# Patient Record
Sex: Male | Born: 1940
Health system: Southern US, Community
[De-identification: ages and names within clinical notes are randomized; demographics above are authoritative.]

## PROBLEM LIST (undated history)

## (undated) DIAGNOSIS — Z794 Long term (current) use of insulin: Secondary | ICD-10-CM

## (undated) DIAGNOSIS — N400 Enlarged prostate without lower urinary tract symptoms: Secondary | ICD-10-CM

## (undated) DIAGNOSIS — I6523 Occlusion and stenosis of bilateral carotid arteries: Secondary | ICD-10-CM

## (undated) DIAGNOSIS — I2510 Atherosclerotic heart disease of native coronary artery without angina pectoris: Secondary | ICD-10-CM

## (undated) DIAGNOSIS — C801 Malignant (primary) neoplasm, unspecified: Secondary | ICD-10-CM

## (undated) DIAGNOSIS — R0600 Dyspnea, unspecified: Secondary | ICD-10-CM

## (undated) DIAGNOSIS — Z972 Presence of dental prosthetic device (complete) (partial): Secondary | ICD-10-CM

## (undated) DIAGNOSIS — T753XXA Motion sickness, initial encounter: Secondary | ICD-10-CM

## (undated) DIAGNOSIS — R0609 Other forms of dyspnea: Secondary | ICD-10-CM

## (undated) DIAGNOSIS — M1990 Unspecified osteoarthritis, unspecified site: Secondary | ICD-10-CM

## (undated) DIAGNOSIS — I714 Abdominal aortic aneurysm, without rupture: Secondary | ICD-10-CM

## (undated) DIAGNOSIS — F32A Depression, unspecified: Secondary | ICD-10-CM

## (undated) DIAGNOSIS — N529 Male erectile dysfunction, unspecified: Secondary | ICD-10-CM

## (undated) DIAGNOSIS — E785 Hyperlipidemia, unspecified: Secondary | ICD-10-CM

## (undated) DIAGNOSIS — I749 Embolism and thrombosis of unspecified artery: Secondary | ICD-10-CM

## (undated) DIAGNOSIS — E119 Type 2 diabetes mellitus without complications: Secondary | ICD-10-CM

## (undated) DIAGNOSIS — I10 Essential (primary) hypertension: Secondary | ICD-10-CM

## (undated) DIAGNOSIS — Z8719 Personal history of other diseases of the digestive system: Secondary | ICD-10-CM

## (undated) DIAGNOSIS — R5383 Other fatigue: Secondary | ICD-10-CM

## (undated) DIAGNOSIS — I350 Nonrheumatic aortic (valve) stenosis: Secondary | ICD-10-CM

## (undated) DIAGNOSIS — M5134 Other intervertebral disc degeneration, thoracic region: Secondary | ICD-10-CM

## (undated) HISTORY — DX: Unspecified osteoarthritis, unspecified site: M19.90

## (undated) HISTORY — DX: Type 2 diabetes mellitus without complications: E11.9

## (undated) HISTORY — PX: HIP SURGERY: SHX245

## (undated) HISTORY — DX: Benign prostatic hyperplasia without lower urinary tract symptoms: N40.0

## (undated) HISTORY — PX: TONSILLECTOMY: SUR1361

## (undated) HISTORY — DX: Dyspnea, unspecified: R06.00

## (undated) HISTORY — PX: HERNIA REPAIR: SHX51

## (undated) HISTORY — PX: ANKLE SURGERY: SHX546

## (undated) HISTORY — PX: INGUINAL HERNIA REPAIR: SUR1180

## (undated) HISTORY — DX: Other fatigue: R53.83

## (undated) HISTORY — DX: Male erectile dysfunction, unspecified: N52.9

## (undated) HISTORY — DX: Essential (primary) hypertension: I10

## (undated) HISTORY — DX: Hyperlipidemia, unspecified: E78.5

## (undated) HISTORY — DX: Other forms of dyspnea: R06.09

## (undated) HISTORY — PX: CARDIAC CATHETERIZATION: SHX172

---
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
## 1983-11-23 DIAGNOSIS — C4402 Squamous cell carcinoma of skin of lip: Secondary | ICD-10-CM

## 1983-11-23 HISTORY — DX: Squamous cell carcinoma of skin of lip: C44.02

## 1987-11-23 DIAGNOSIS — I749 Embolism and thrombosis of unspecified artery: Secondary | ICD-10-CM

## 1987-11-23 HISTORY — DX: Embolism and thrombosis of unspecified artery: I74.9

## 2007-10-12 ENCOUNTER — Ambulatory Visit: Payer: Self-pay | Admitting: Gastroenterology

## 2009-04-12 ENCOUNTER — Emergency Department: Payer: Self-pay | Admitting: Emergency Medicine

## 2012-01-05 ENCOUNTER — Ambulatory Visit: Payer: Self-pay | Admitting: Family Medicine

## 2012-07-23 DIAGNOSIS — I2510 Atherosclerotic heart disease of native coronary artery without angina pectoris: Secondary | ICD-10-CM | POA: Insufficient documentation

## 2012-08-16 DIAGNOSIS — I350 Nonrheumatic aortic (valve) stenosis: Secondary | ICD-10-CM

## 2012-08-16 HISTORY — DX: Nonrheumatic aortic (valve) stenosis: I35.0

## 2012-08-30 ENCOUNTER — Ambulatory Visit: Payer: Self-pay | Admitting: Internal Medicine

## 2012-08-30 DIAGNOSIS — I2510 Atherosclerotic heart disease of native coronary artery without angina pectoris: Secondary | ICD-10-CM

## 2012-08-30 HISTORY — DX: Atherosclerotic heart disease of native coronary artery without angina pectoris: I25.10

## 2012-09-01 ENCOUNTER — Other Ambulatory Visit: Payer: Self-pay

## 2012-09-01 ENCOUNTER — Encounter (HOSPITAL_COMMUNITY): Payer: Self-pay | Admitting: Pharmacy Technician

## 2012-09-01 ENCOUNTER — Institutional Professional Consult (permissible substitution) (INDEPENDENT_AMBULATORY_CARE_PROVIDER_SITE_OTHER): Payer: Medicare Other | Admitting: Cardiothoracic Surgery

## 2012-09-01 VITALS — BP 112/66 | HR 86 | Resp 20 | Ht 70.9 in | Wt 194.0 lb

## 2012-09-01 DIAGNOSIS — E119 Type 2 diabetes mellitus without complications: Secondary | ICD-10-CM | POA: Insufficient documentation

## 2012-09-01 DIAGNOSIS — I10 Essential (primary) hypertension: Secondary | ICD-10-CM | POA: Insufficient documentation

## 2012-09-01 DIAGNOSIS — N400 Enlarged prostate without lower urinary tract symptoms: Secondary | ICD-10-CM | POA: Insufficient documentation

## 2012-09-01 DIAGNOSIS — R5383 Other fatigue: Secondary | ICD-10-CM | POA: Insufficient documentation

## 2012-09-01 DIAGNOSIS — I200 Unstable angina: Secondary | ICD-10-CM

## 2012-09-01 DIAGNOSIS — R0600 Dyspnea, unspecified: Secondary | ICD-10-CM | POA: Insufficient documentation

## 2012-09-01 DIAGNOSIS — I2510 Atherosclerotic heart disease of native coronary artery without angina pectoris: Secondary | ICD-10-CM

## 2012-09-01 DIAGNOSIS — R0609 Other forms of dyspnea: Secondary | ICD-10-CM

## 2012-09-01 DIAGNOSIS — E785 Hyperlipidemia, unspecified: Secondary | ICD-10-CM | POA: Insufficient documentation

## 2012-09-01 NOTE — Patient Instructions (Signed)
Begin your prescription of metoprolol succinate 25 mg daily Your surgery will be scheduled for October 17 at [facility] He should stop your metformin prior to surgery last dose the morning of October 15
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
## 2012-09-05 ENCOUNTER — Ambulatory Visit (HOSPITAL_COMMUNITY)
Admission: RE | Admit: 2012-09-05 | Discharge: 2012-09-05 | Disposition: A | Discharge status: Home / Self Care | Payer: Medicare Other | Source: Ambulatory Visit | Attending: Cardiothoracic Surgery | Admitting: Cardiothoracic Surgery

## 2012-09-05 ENCOUNTER — Encounter (HOSPITAL_COMMUNITY)
Admission: RE | Admit: 2012-09-05 | Discharge: 2012-09-05 | Disposition: A | Discharge status: Home / Self Care | Payer: Medicare Other | Source: Ambulatory Visit | Attending: Cardiothoracic Surgery | Admitting: Cardiothoracic Surgery

## 2012-09-05 ENCOUNTER — Encounter (HOSPITAL_COMMUNITY): Payer: Self-pay

## 2012-09-05 VITALS — BP 93/60 | HR 71 | Temp 98.0°F | Resp 20 | Ht 71.0 in | Wt 194.8 lb

## 2012-09-05 DIAGNOSIS — I2510 Atherosclerotic heart disease of native coronary artery without angina pectoris: Secondary | ICD-10-CM

## 2012-09-05 DIAGNOSIS — E119 Type 2 diabetes mellitus without complications: Secondary | ICD-10-CM | POA: Insufficient documentation

## 2012-09-05 DIAGNOSIS — E785 Hyperlipidemia, unspecified: Secondary | ICD-10-CM | POA: Insufficient documentation

## 2012-09-05 DIAGNOSIS — Z01810 Encounter for preprocedural cardiovascular examination: Secondary | ICD-10-CM

## 2012-09-05 DIAGNOSIS — Z01818 Encounter for other preprocedural examination: Secondary | ICD-10-CM | POA: Insufficient documentation

## 2012-09-05 DIAGNOSIS — I10 Essential (primary) hypertension: Secondary | ICD-10-CM

## 2012-09-05 DIAGNOSIS — Z01812 Encounter for preprocedural laboratory examination: Secondary | ICD-10-CM | POA: Insufficient documentation

## 2012-09-05 HISTORY — DX: Atherosclerotic heart disease of native coronary artery without angina pectoris: I25.10

## 2012-09-05 LAB — BLOOD GAS, ARTERIAL
Acid-Base Excess: 0.3 mmol/L (ref 0.0–2.0)
Bicarbonate: 24.2 mEq/L — ABNORMAL HIGH (ref 20.0–24.0)
Drawn by: 344381
FIO2: 0.21 %
O2 Saturation: 96.5 %
Patient temperature: 98.6
TCO2: 25.3 mmol/L (ref 0–100)
pCO2 arterial: 37.4 mmHg (ref 35.0–45.0)
pH, Arterial: 7.427 (ref 7.350–7.450)
pO2, Arterial: 83.3 mmHg (ref 80.0–100.0)

## 2012-09-05 LAB — CBC
HCT: 38.1 % — ABNORMAL LOW (ref 39.0–52.0)
Hemoglobin: 12.9 g/dL — ABNORMAL LOW (ref 13.0–17.0)
MCH: 29.4 pg (ref 26.0–34.0)
MCHC: 33.9 g/dL (ref 30.0–36.0)
MCV: 86.8 fL (ref 78.0–100.0)
Platelets: 280 10*3/uL (ref 150–400)
RBC: 4.39 MIL/uL (ref 4.22–5.81)
RDW: 12.2 % (ref 11.5–15.5)
WBC: 7.9 10*3/uL (ref 4.0–10.5)

## 2012-09-05 LAB — URINALYSIS, ROUTINE W REFLEX MICROSCOPIC
Bilirubin Urine: NEGATIVE
Glucose, UA: >1000 mg/dL — AB
Hgb urine dipstick: NEGATIVE
Ketones, ur: NEGATIVE mg/dL
Leukocytes, UA: NEGATIVE
Nitrite: NEGATIVE
Protein, ur: NEGATIVE mg/dL
Specific Gravity, Urine: 1.024 (ref 1.005–1.030)
Urobilinogen, UA: 1 mg/dL (ref 0.0–1.0)
pH: 6 (ref 5.0–8.0)

## 2012-09-05 LAB — COMPREHENSIVE METABOLIC PANEL
ALT: 23 U/L (ref 0–53)
AST: 19 U/L (ref 0–37)
Albumin: 3.9 g/dL (ref 3.5–5.2)
Alkaline Phosphatase: 102 U/L (ref 39–117)
BUN: 26 mg/dL — ABNORMAL HIGH (ref 6–23)
CO2: 26 mEq/L (ref 19–32)
Calcium: 10.1 mg/dL (ref 8.4–10.5)
Chloride: 98 mEq/L (ref 96–112)
Creatinine, Ser: 1.02 mg/dL (ref 0.50–1.35)
GFR calc Af Amer: 83 mL/min — ABNORMAL LOW (ref >90)
GFR calc non Af Amer: 71 mL/min — ABNORMAL LOW (ref >90)
Glucose, Bld: 373 mg/dL — ABNORMAL HIGH (ref 70–99)
Potassium: 4.4 mEq/L (ref 3.5–5.1)
Sodium: 133 mEq/L — ABNORMAL LOW (ref 135–145)
Total Bilirubin: 0.7 mg/dL (ref 0.3–1.2)
Total Protein: 7.6 g/dL (ref 6.0–8.3)

## 2012-09-05 LAB — PULMONARY FUNCTION TEST

## 2012-09-05 LAB — PROTIME-INR
INR: 0.99 (ref 0.00–1.49)
Prothrombin Time: 13 seconds (ref 11.6–15.2)

## 2012-09-05 LAB — SURGICAL PCR SCREEN
MRSA, PCR: NEGATIVE
Staphylococcus aureus: POSITIVE — AB

## 2012-09-05 LAB — APTT: aPTT: 27 seconds (ref 24–37)

## 2012-09-05 LAB — HEMOGLOBIN A1C
Hgb A1c MFr Bld: 10.3 % — ABNORMAL HIGH (ref <5.7)
Mean Plasma Glucose: 249 mg/dL — ABNORMAL HIGH (ref <117)

## 2012-09-05 LAB — URINE MICROSCOPIC-ADD ON

## 2012-09-05 LAB — ABO/RH: ABO/RH(D): O POS

## 2012-09-05 NOTE — Progress Notes (Signed)
Requested OV,Stress test,ECHO,cath report from Dr. Philemon Kingdom office.

## 2012-09-05 NOTE — Progress Notes (Signed)
VASCULAR LAB PRELIMINARY  PRELIMINARY  PRELIMINARY  PRELIMINARY  Pre-op Cardiac Surgery  Carotid Findings:  No evidence of significant ICA stenosis and vertebral artery flow is antegrade bilaterally.  Upper Extremity Right Left  Brachial Pressures 99 T 109 T  Radial Waveforms T T  Ulnar Waveforms T T  Palmar Arch (Allen's Test) * **   Findings:  *Right:  Doppler waveforms remain normal with ulnar and radial compressions.                     **Left:  Doppler waveforms decrease >50% with radial and remain normal with ulnar compressions.    Lower  Extremity Right Left  Dorsalis Pedis    Anterior Tibial 92 Severe DM 291 M  Posterior Tibial 190 Severe DM 47 Severe DM  Ankle/Brachial Indices >1.0 >1.0    Findings:  ABI is within normal limits with abnormal Doppler waveforms bilaterally.   Antonio Villarreal, 09/05/2012, 12:57 PM

## 2012-09-05 NOTE — Pre-Procedure Instructions (Signed)
20 TIBURCIO LINDER  09/05/2012   Your procedure is scheduled on:  09-07-2012  Report to [facility] at 5:30 AM.  Call this number if you have problems the morning of surgery: [phone]   Remember:   Do not eat food or drink:After Midnight.      Take these medicines the morning of surgery with A SIP OF WATER: imdur   Do not wear jewelry,  Do not wear lotions, powders, or perfumes. You may wear deodorant.  Do not shave 48 hours prior to surgery. Men may shave face and neck.  Do not bring valuables to the hospital.  Contacts, dentures or bridgework may not be worn into surgery.  Leave suitcase in the car. After surgery it may be brought to your room.    For patients admitted to the hospital, checkout time is 11:00 AM the day of discharge.    *  Special Instructions: Incentive Spirometry - Practice and bring it with you on the day of surgery. Shower using CHG 2 nights before surgery and the night before surgery.  If you shower the day of surgery use CHG.  Use special wash - you have one bottle of CHG for all showers.  You should use approximately 1/3 of the bottle for each shower.      Please read over the following fact sheets that you were given: Pain Booklet, Coughing and Deep Breathing, Blood Transfusion Information, Open Heart Packet, MRSA Information and Surgical Site Infection Prevention

## 2012-09-06 ENCOUNTER — Encounter (HOSPITAL_COMMUNITY): Payer: Self-pay | Admitting: Vascular Surgery

## 2012-09-06 ENCOUNTER — Telehealth: Payer: Self-pay | Admitting: *Deleted

## 2012-09-06 NOTE — Progress Notes (Signed)
2nd request made to Dr. Philemon Kingdom office for office note, cath report,ECHO and stress test.

## 2012-09-06 NOTE — Telephone Encounter (Signed)
Spoke with patient and his wife about having to cancel tomorrow's surgery due to uncontrolled diabetes.  Patient understands and will f/u with PCP for diabetes management and will f/u with Dr. Donata Clay in office on 09/13/12.  All questions answered.  Will continue to follow up as needed.
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
## 2012-09-07 ENCOUNTER — Inpatient Hospital Stay (HOSPITAL_COMMUNITY): Admission: RE | Admit: 2012-09-07 | Payer: Medicare Other | Source: Ambulatory Visit | Admitting: Cardiothoracic Surgery

## 2012-09-07 SURGERY — CORONARY ARTERY BYPASS GRAFTING (CABG)
Anesthesia: General | Site: Chest

## 2012-09-13 ENCOUNTER — Ambulatory Visit (INDEPENDENT_AMBULATORY_CARE_PROVIDER_SITE_OTHER): Payer: Medicare Other | Admitting: Cardiothoracic Surgery

## 2012-09-13 ENCOUNTER — Encounter (HOSPITAL_COMMUNITY): Payer: Self-pay | Admitting: Pharmacy Technician

## 2012-09-13 ENCOUNTER — Other Ambulatory Visit: Payer: Self-pay

## 2012-09-13 ENCOUNTER — Encounter: Payer: Self-pay | Admitting: Cardiothoracic Surgery

## 2012-09-13 VITALS — BP 122/71 | HR 66 | Resp 18 | Ht 70.5 in | Wt 196.0 lb

## 2012-09-13 DIAGNOSIS — IMO0001 Reserved for inherently not codable concepts without codable children: Secondary | ICD-10-CM

## 2012-09-13 DIAGNOSIS — E1165 Type 2 diabetes mellitus with hyperglycemia: Secondary | ICD-10-CM

## 2012-09-13 DIAGNOSIS — I2510 Atherosclerotic heart disease of native coronary artery without angina pectoris: Secondary | ICD-10-CM

## 2012-09-13 DIAGNOSIS — I200 Unstable angina: Secondary | ICD-10-CM

## 2012-09-13 NOTE — Progress Notes (Signed)
PCP is Lamar Blinks, MD Referring Provider is Lamar Blinks, MD  Chief Complaint  Patient presents with  . Coronary Artery Disease    Further discuss surgery    HPI: 71 year old Caucasian male diabetic with multivessel disease scheduled for heart bypass surgery last week. When he reported for preassessment his blood sugar was 390 and surgery was canceled. [facility] and has been placed on Lantus insulin 20 units daily and Humalog 8 units 3 times a day with meals. Blood sugars are running between 120 and 170. He feels better.   Past Medical History  Diagnosis Date  . Hypertension   . Hyperlipidemia   . Diabetes mellitus   . Dyspnea on exertion   . Fatigue   . BPH (benign prostatic hyperplasia)   . Erectile dysfunction   . Arthritis   . Coronary artery disease     Past Surgical History  Procedure Date  . Hernia repair   . Hip surgery   . Ankle surgery   . Tonsillectomy     Family History  Problem Relation Age of Onset  . Heart attack Father     age 68 and 46  . Heart disease Father   . Heart disease Mother   . Coronary artery disease Mother     CABG age 51  . Lung cancer Mother     Social History History  Substance Use Topics  . Smoking status: Former Smoker -- 1.5 packs/day for 35 years    Types: Cigarettes    Quit date: 11/23/1987  . Smokeless tobacco: Never Used  . Alcohol Use: No    Current Outpatient Prescriptions  Medication Sig Dispense Refill  . co-enzyme Q-10 30 MG capsule Take 30 mg by mouth daily.      Marland Kitchen HUMALOG KWIKPEN 100 UNIT/ML injection 8 Units 3 (three) times daily before meals.       . isosorbide mononitrate (IMDUR) 30 MG 24 hr tablet Take 30 mg by mouth daily.       Marland Kitchen LANTUS SOLOSTAR 100 UNIT/ML injection 20 Units every morning.       Marland Kitchen lisinopril-hydrochlorothiazide (PRINZIDE,ZESTORETIC) 10-12.5 MG per tablet Take 1 tablet by mouth daily.      Marland Kitchen lovastatin (MEVACOR) 40 MG  tablet Take 40 mg by mouth at bedtime.      . metoprolol succinate (TOPROL-XL) 25 MG 24 hr tablet Take 25 mg by mouth daily.       . naproxen (NAPROSYN) 500 MG tablet Take 500 mg by mouth 2 (two) times daily between meals as needed. For pain        Allergies  Allergen Reactions  . Januvia (Sitagliptin) Other (See Comments)    GI Upset  . Morphine And Related Nausea And Vomiting    Review of Systems no fever no angina no shortness of breath no rash no edema  BP 122/71  Pulse 66  Resp 18  Ht 5' 10.5" (1.791 m)  Wt 196 lb (88.905 kg)  BMI 27.73 kg/m2  SpO2 95% Physical Exam Alert and comfortable Heart rate regular no murmur Lungs clear No pedal edema Neuro intact  Diagnostic Tests:  CBG's-blood sugar record reviewed showing adequate diabetic control to schedule elective CABG scheduled October 28 at [place] Impression: Three-vessel disease Diabetic control improved  Plan: CABG at [facility] hospital October 28. Procedure and risks reviewed with patient and family. They understand the risk of wound infection is greater when blood  sugars are high and poorly controlled.

## 2012-09-15 ENCOUNTER — Encounter (HOSPITAL_COMMUNITY): Payer: Self-pay

## 2012-09-15 ENCOUNTER — Encounter (HOSPITAL_COMMUNITY)
Admission: RE | Admit: 2012-09-15 | Discharge: 2012-09-15 | Disposition: A | Discharge status: Home / Self Care | Payer: Medicare Other | Source: Ambulatory Visit | Attending: Cardiothoracic Surgery | Admitting: Cardiothoracic Surgery

## 2012-09-15 VITALS — BP 87/56 | HR 65 | Temp 97.4°F | Resp 18

## 2012-09-15 DIAGNOSIS — I2510 Atherosclerotic heart disease of native coronary artery without angina pectoris: Secondary | ICD-10-CM

## 2012-09-15 HISTORY — DX: Embolism and thrombosis of unspecified artery: I74.9

## 2012-09-15 HISTORY — DX: Malignant (primary) neoplasm, unspecified: C80.1

## 2012-09-15 LAB — COMPREHENSIVE METABOLIC PANEL
ALT: 29 U/L (ref 0–53)
AST: 25 U/L (ref 0–37)
Albumin: 3.6 g/dL (ref 3.5–5.2)
Alkaline Phosphatase: 81 U/L (ref 39–117)
BUN: 29 mg/dL — ABNORMAL HIGH (ref 6–23)
CO2: 21 mEq/L (ref 19–32)
Calcium: 9.3 mg/dL (ref 8.4–10.5)
Chloride: 100 mEq/L (ref 96–112)
Creatinine, Ser: 0.89 mg/dL (ref 0.50–1.35)
GFR calc Af Amer: >90 mL/min (ref >90)
GFR calc non Af Amer: 83 mL/min — ABNORMAL LOW (ref >90)
Glucose, Bld: 146 mg/dL — ABNORMAL HIGH (ref 70–99)
Potassium: 4.5 mEq/L (ref 3.5–5.1)
Sodium: 132 mEq/L — ABNORMAL LOW (ref 135–145)
Total Bilirubin: 0.6 mg/dL (ref 0.3–1.2)
Total Protein: 7.1 g/dL (ref 6.0–8.3)

## 2012-09-15 LAB — TYPE AND SCREEN
ABO/RH(D): O POS
ABO/RH(D): O POS
Antibody Screen: NEGATIVE
Antibody Screen: NEGATIVE

## 2012-09-15 LAB — URINALYSIS, ROUTINE W REFLEX MICROSCOPIC
Bilirubin Urine: NEGATIVE
Glucose, UA: NEGATIVE mg/dL
Hgb urine dipstick: NEGATIVE
Ketones, ur: 15 mg/dL — AB
Leukocytes, UA: NEGATIVE
Nitrite: NEGATIVE
Protein, ur: NEGATIVE mg/dL
Specific Gravity, Urine: 1.027 (ref 1.005–1.030)
Urobilinogen, UA: 0.2 mg/dL (ref 0.0–1.0)
pH: 5.5 (ref 5.0–8.0)

## 2012-09-15 LAB — BLOOD GAS, ARTERIAL
Acid-base deficit: 0.9 mmol/L (ref 0.0–2.0)
Bicarbonate: 23.1 mEq/L (ref 20.0–24.0)
Drawn by: 20636
O2 Saturation: 94.9 %
Patient temperature: 98.6
TCO2: 24.2 mmol/L (ref 0–100)
pCO2 arterial: 36.9 mmHg (ref 35.0–45.0)
pH, Arterial: 7.413 (ref 7.350–7.450)
pO2, Arterial: 74.3 mmHg — ABNORMAL LOW (ref 80.0–100.0)

## 2012-09-15 LAB — CBC
HCT: 36.6 % — ABNORMAL LOW (ref 39.0–52.0)
Hemoglobin: 12.5 g/dL — ABNORMAL LOW (ref 13.0–17.0)
MCH: 30 pg (ref 26.0–34.0)
MCHC: 34.2 g/dL (ref 30.0–36.0)
MCV: 88 fL (ref 78.0–100.0)
Platelets: 250 10*3/uL (ref 150–400)
RBC: 4.16 MIL/uL — ABNORMAL LOW (ref 4.22–5.81)
RDW: 12.6 % (ref 11.5–15.5)
WBC: 8.2 10*3/uL (ref 4.0–10.5)

## 2012-09-15 LAB — HEMOGLOBIN A1C
Hgb A1c MFr Bld: 9.5 % — ABNORMAL HIGH (ref <5.7)
Mean Plasma Glucose: 226 mg/dL — ABNORMAL HIGH (ref <117)

## 2012-09-15 LAB — APTT: aPTT: 31 seconds (ref 24–37)

## 2012-09-15 LAB — PROTIME-INR
INR: 1.02 (ref 0.00–1.49)
Prothrombin Time: 13.3 seconds (ref 11.6–15.2)

## 2012-09-15 NOTE — Pre-Procedure Instructions (Signed)
20 Antonio Villarreal  09/15/2012   Your procedure is scheduled on:  Monday September 18, 2012  Report to [facility] at 5:30 AM.  Call this number if you have problems the morning of surgery: [phone]   Remember:   Do not eat food or drink :After Midnight.      Take these medicines the morning of surgery with A SIP OF WATER: isosorbide, metoprolol,    Do not wear jewelry, make-up or nail polish.  Do not wear lotions, powders, or perfumes.   Do not shave 48 hours prior to surgery. Men may shave face and neck.  Do not bring valuables to the hospital.  Contacts, dentures or bridgework may not be worn into surgery.  Leave suitcase in the car. After surgery it may be brought to your room.  For patients admitted to the hospital, checkout time is 11:00 AM the day of discharge.   Patients discharged the day of surgery will not be allowed to drive home.  Name and phone number of your driver: family / friend  Special Instructions: Incentive Spirometry - Practice and bring it with you on the day of surgery. Shower using CHG 2 nights before surgery and the night before surgery.  If you shower the day of surgery use CHG.  Use special wash - you have one bottle of CHG for all showers.  You should use approximately 1/3 of the bottle for each shower.   Please read over the following fact sheets that you were given: Pain Booklet, Coughing and Deep Breathing, Blood Transfusion Information, Open Heart Packet, Total Joint Packet and MRSA Information

## 2012-09-15 NOTE — Progress Notes (Signed)
Pt. Has been seen by Practitioner for diabetes control. Has started insulin & has now returned for PAT appt. For CABG, rescheduled for 09/18/2012.

## 2012-09-17 MED ORDER — SODIUM CHLORIDE 0.9 % IV SOLN
INTRAVENOUS | Status: AC
  Administered 2012-09-18: 70 mL/h via INTRAVENOUS
  Filled 2012-09-17: qty 40

## 2012-09-17 MED ORDER — DEXTROSE 5 % IV SOLN
750.0000 mg | INTRAVENOUS | Status: DC
  Filled 2012-09-17: qty 750

## 2012-09-17 MED ORDER — DEXMEDETOMIDINE HCL IN NACL 400 MCG/100ML IV SOLN
0.1000 ug/kg/h | INTRAVENOUS | Status: AC
  Administered 2012-09-18: 0.2 ug/kg/h via INTRAVENOUS
  Filled 2012-09-17: qty 100

## 2012-09-17 MED ORDER — NITROGLYCERIN IN D5W 200-5 MCG/ML-% IV SOLN
2.0000 ug/min | INTRAVENOUS | Status: AC
  Administered 2012-09-18: 5 ug/min via INTRAVENOUS
  Filled 2012-09-17: qty 250

## 2012-09-17 MED ORDER — EPINEPHRINE HCL 1 MG/ML IJ SOLN
0.5000 ug/min | INTRAVENOUS | Status: DC
  Filled 2012-09-17: qty 4

## 2012-09-17 MED ORDER — PHENYLEPHRINE HCL 10 MG/ML IJ SOLN
30.0000 ug/min | INTRAVENOUS | Status: AC
  Administered 2012-09-18: 20 ug/min via INTRAVENOUS
  Filled 2012-09-17: qty 2

## 2012-09-17 MED ORDER — DOPAMINE-DEXTROSE 3.2-5 MG/ML-% IV SOLN
2.0000 ug/kg/min | INTRAVENOUS | Status: AC
  Administered 2012-09-18: 3 ug/kg/min via INTRAVENOUS
  Filled 2012-09-17: qty 250

## 2012-09-17 MED ORDER — MAGNESIUM SULFATE 50 % IJ SOLN
40.0000 meq | INTRAMUSCULAR | Status: DC
  Filled 2012-09-17: qty 10

## 2012-09-17 MED ORDER — SODIUM CHLORIDE 0.9 % IV SOLN
INTRAVENOUS | Status: AC
  Administered 2012-09-18: 1 [IU]/h via INTRAVENOUS
  Filled 2012-09-17: qty 1

## 2012-09-17 MED ORDER — DEXTROSE 5 % IV SOLN
1.5000 g | INTRAVENOUS | Status: AC
  Administered 2012-09-18: .75 g via INTRAVENOUS
  Administered 2012-09-18: 1.5 g via INTRAVENOUS
  Filled 2012-09-17: qty 1.5

## 2012-09-17 MED ORDER — METOPROLOL TARTRATE 12.5 MG HALF TABLET: 12.5000 mg | ORAL_TABLET | Freq: Once | ORAL | Status: DC

## 2012-09-17 MED ORDER — PAPAVERINE HCL 30 MG/ML IJ SOLN
INTRAMUSCULAR | Status: AC
  Administered 2012-09-18: 09:00:00
  Filled 2012-09-17: qty 2.5

## 2012-09-17 MED ORDER — SODIUM CHLORIDE 0.9 % IV SOLN
1500.0000 mg | INTRAVENOUS | Status: AC
  Administered 2012-09-18: 1500 mg via INTRAVENOUS
  Filled 2012-09-17: qty 1500

## 2012-09-17 MED ORDER — POTASSIUM CHLORIDE 2 MEQ/ML IV SOLN
80.0000 meq | INTRAVENOUS | Status: DC
  Filled 2012-09-17: qty 40

## 2012-09-18 ENCOUNTER — Encounter (HOSPITAL_COMMUNITY)
Admission: RE | Disposition: A | Discharge status: Home / Self Care | Payer: Self-pay | Source: Ambulatory Visit | Attending: Cardiothoracic Surgery

## 2012-09-18 ENCOUNTER — Inpatient Hospital Stay (HOSPITAL_COMMUNITY)
Admission: RE | Admit: 2012-09-18 | Discharge: 2012-09-23 | DRG: 236 | Disposition: A | Discharge status: Home / Self Care | Payer: Medicare Other | Source: Ambulatory Visit | Attending: Cardiothoracic Surgery | Admitting: Cardiothoracic Surgery

## 2012-09-18 ENCOUNTER — Inpatient Hospital Stay (HOSPITAL_COMMUNITY): Payer: Medicare Other

## 2012-09-18 ENCOUNTER — Encounter (HOSPITAL_COMMUNITY): Payer: Self-pay | Admitting: *Deleted

## 2012-09-18 ENCOUNTER — Encounter (HOSPITAL_COMMUNITY): Payer: Self-pay | Admitting: Certified Registered"

## 2012-09-18 ENCOUNTER — Inpatient Hospital Stay (HOSPITAL_COMMUNITY): Payer: Medicare Other | Admitting: Certified Registered"

## 2012-09-18 DIAGNOSIS — E785 Hyperlipidemia, unspecified: Secondary | ICD-10-CM | POA: Diagnosis present

## 2012-09-18 DIAGNOSIS — Z951 Presence of aortocoronary bypass graft: Secondary | ICD-10-CM

## 2012-09-18 DIAGNOSIS — Z23 Encounter for immunization: Secondary | ICD-10-CM

## 2012-09-18 DIAGNOSIS — I209 Angina pectoris, unspecified: Secondary | ICD-10-CM | POA: Diagnosis present

## 2012-09-18 DIAGNOSIS — I2510 Atherosclerotic heart disease of native coronary artery without angina pectoris: Principal | ICD-10-CM | POA: Diagnosis present

## 2012-09-18 DIAGNOSIS — I359 Nonrheumatic aortic valve disorder, unspecified: Secondary | ICD-10-CM | POA: Diagnosis present

## 2012-09-18 DIAGNOSIS — D62 Acute posthemorrhagic anemia: Secondary | ICD-10-CM | POA: Diagnosis not present

## 2012-09-18 DIAGNOSIS — R000 Tachycardia, unspecified: Secondary | ICD-10-CM | POA: Diagnosis present

## 2012-09-18 DIAGNOSIS — N400 Enlarged prostate without lower urinary tract symptoms: Secondary | ICD-10-CM | POA: Diagnosis present

## 2012-09-18 DIAGNOSIS — E119 Type 2 diabetes mellitus without complications: Secondary | ICD-10-CM | POA: Diagnosis present

## 2012-09-18 HISTORY — PX: TEE WITHOUT CARDIOVERSION: SHX5443

## 2012-09-18 HISTORY — PX: CORONARY ARTERY BYPASS GRAFT: SHX141

## 2012-09-18 HISTORY — DX: Presence of aortocoronary bypass graft: Z95.1

## 2012-09-18 LAB — CBC
HCT: 30.7 % — ABNORMAL LOW (ref 39.0–52.0)
Hemoglobin: 10.3 g/dL — ABNORMAL LOW (ref 13.0–17.0)
Hemoglobin: 10.2 g/dL — ABNORMAL LOW (ref 13.0–17.0)
MCH: 29.2 pg (ref 26.0–34.0)
MCH: 28.9 pg (ref 26.0–34.0)
MCHC: 33.8 g/dL (ref 30.0–36.0)
MCHC: 33.2 g/dL (ref 30.0–36.0)
MCV: 86.4 fL (ref 78.0–100.0)
MCV: 87 fL (ref 78.0–100.0)
Platelets: 149 10*3/uL — ABNORMAL LOW (ref 150–400)
Platelets: 152 10*3/uL (ref 150–400)
RBC: 3.53 MIL/uL — ABNORMAL LOW (ref 4.22–5.81)
RBC: 3.53 MIL/uL — ABNORMAL LOW (ref 4.22–5.81)
RDW: 12.7 % (ref 11.5–15.5)
WBC: 11.7 10*3/uL — ABNORMAL HIGH (ref 4.0–10.5)

## 2012-09-18 LAB — POCT I-STAT 4, (NA,K, GLUC, HGB,HCT)
Glucose, Bld: 155 mg/dL — ABNORMAL HIGH (ref 70–99)
Glucose, Bld: 121 mg/dL — ABNORMAL HIGH (ref 70–99)
Glucose, Bld: 105 mg/dL — ABNORMAL HIGH (ref 70–99)
Glucose, Bld: 92 mg/dL (ref 70–99)
Glucose, Bld: 155 mg/dL — ABNORMAL HIGH (ref 70–99)
Glucose, Bld: 118 mg/dL — ABNORMAL HIGH (ref 70–99)
HCT: 34 % — ABNORMAL LOW (ref 39.0–52.0)
HCT: 34 % — ABNORMAL LOW (ref 39.0–52.0)
HCT: 26 % — ABNORMAL LOW (ref 39.0–52.0)
HCT: 25 % — ABNORMAL LOW (ref 39.0–52.0)
HCT: 26 % — ABNORMAL LOW (ref 39.0–52.0)
HCT: 32 % — ABNORMAL LOW (ref 39.0–52.0)
Hemoglobin: 11.6 g/dL — ABNORMAL LOW (ref 13.0–17.0)
Hemoglobin: 11.6 g/dL — ABNORMAL LOW (ref 13.0–17.0)
Hemoglobin: 8.8 g/dL — ABNORMAL LOW (ref 13.0–17.0)
Hemoglobin: 8.5 g/dL — ABNORMAL LOW (ref 13.0–17.0)
Hemoglobin: 8.8 g/dL — ABNORMAL LOW (ref 13.0–17.0)
Hemoglobin: 10.9 g/dL — ABNORMAL LOW (ref 13.0–17.0)
Potassium: 3.9 mEq/L (ref 3.5–5.1)
Potassium: 3.9 mEq/L (ref 3.5–5.1)
Potassium: 3.5 mEq/L (ref 3.5–5.1)
Potassium: 4.5 mEq/L (ref 3.5–5.1)
Potassium: 4.1 mEq/L (ref 3.5–5.1)
Potassium: 3.7 mEq/L (ref 3.5–5.1)
Sodium: 140 mEq/L (ref 135–145)
Sodium: 142 mEq/L (ref 135–145)
Sodium: 139 mEq/L (ref 135–145)
Sodium: 137 mEq/L (ref 135–145)
Sodium: 137 mEq/L (ref 135–145)
Sodium: 142 mEq/L (ref 135–145)

## 2012-09-18 LAB — POCT I-STAT 3, ART BLOOD GAS (G3+)
Acid-base deficit: 2 mmol/L (ref 0.0–2.0)
Acid-base deficit: 1 mmol/L (ref 0.0–2.0)
Acid-base deficit: 1 mmol/L (ref 0.0–2.0)
Acid-base deficit: 2 mmol/L (ref 0.0–2.0)
Bicarbonate: 26.3 mEq/L — ABNORMAL HIGH (ref 20.0–24.0)
Bicarbonate: 24.2 mEq/L — ABNORMAL HIGH (ref 20.0–24.0)
Bicarbonate: 24.2 mEq/L — ABNORMAL HIGH (ref 20.0–24.0)
Bicarbonate: 24.4 mEq/L — ABNORMAL HIGH (ref 20.0–24.0)
Bicarbonate: 23.8 mEq/L (ref 20.0–24.0)
O2 Saturation: 100 %
O2 Saturation: 98 %
O2 Saturation: 96 %
O2 Saturation: 96 %
O2 Saturation: 93 %
Patient temperature: 35.5
Patient temperature: 37.1
Patient temperature: 37.6
TCO2: 28 mmol/L (ref 0–100)
TCO2: 26 mmol/L (ref 0–100)
TCO2: 25 mmol/L (ref 0–100)
TCO2: 26 mmol/L (ref 0–100)
TCO2: 25 mmol/L (ref 0–100)
pCO2 arterial: 47.8 mmHg — ABNORMAL HIGH (ref 35.0–45.0)
pCO2 arterial: 47 mmHg — ABNORMAL HIGH (ref 35.0–45.0)
pCO2 arterial: 38.7 mmHg (ref 35.0–45.0)
pCO2 arterial: 45 mmHg (ref 35.0–45.0)
pCO2 arterial: 43.6 mmHg (ref 35.0–45.0)
pH, Arterial: 7.348 — ABNORMAL LOW (ref 7.350–7.450)
pH, Arterial: 7.319 — ABNORMAL LOW (ref 7.350–7.450)
pH, Arterial: 7.398 (ref 7.350–7.450)
pH, Arterial: 7.343 — ABNORMAL LOW (ref 7.350–7.450)
pH, Arterial: 7.347 — ABNORMAL LOW (ref 7.350–7.450)
pO2, Arterial: 261 mmHg — ABNORMAL HIGH (ref 80.0–100.0)
pO2, Arterial: 122 mmHg — ABNORMAL HIGH (ref 80.0–100.0)
pO2, Arterial: 77 mmHg — ABNORMAL LOW (ref 80.0–100.0)
pO2, Arterial: 86 mmHg (ref 80.0–100.0)
pO2, Arterial: 73 mmHg — ABNORMAL LOW (ref 80.0–100.0)

## 2012-09-18 LAB — POCT I-STAT, CHEM 8
BUN: 13 mg/dL (ref 6–23)
Calcium, Ion: 1.12 mmol/L — ABNORMAL LOW (ref 1.13–1.30)
Chloride: 105 mEq/L (ref 96–112)
Creatinine, Ser: 1 mg/dL (ref 0.50–1.35)
Glucose, Bld: 163 mg/dL — ABNORMAL HIGH (ref 70–99)
HCT: 30 % — ABNORMAL LOW (ref 39.0–52.0)
Hemoglobin: 10.2 g/dL — ABNORMAL LOW (ref 13.0–17.0)
Potassium: 4.3 mEq/L (ref 3.5–5.1)
Sodium: 139 mEq/L (ref 135–145)
TCO2: 20 mmol/L (ref 0–100)

## 2012-09-18 LAB — HEMOGLOBIN AND HEMATOCRIT, BLOOD
HCT: 24.9 % — ABNORMAL LOW (ref 39.0–52.0)
Hemoglobin: 8.3 g/dL — ABNORMAL LOW (ref 13.0–17.0)

## 2012-09-18 LAB — GLUCOSE, CAPILLARY
Glucose-Capillary: 125 mg/dL — ABNORMAL HIGH (ref 70–99)
Glucose-Capillary: 102 mg/dL — ABNORMAL HIGH (ref 70–99)
Glucose-Capillary: 66 mg/dL — ABNORMAL LOW (ref 70–99)
Glucose-Capillary: 74 mg/dL (ref 70–99)
Glucose-Capillary: 81 mg/dL (ref 70–99)
Glucose-Capillary: 138 mg/dL — ABNORMAL HIGH (ref 70–99)
Glucose-Capillary: 152 mg/dL — ABNORMAL HIGH (ref 70–99)

## 2012-09-18 LAB — CREATININE, SERUM
Creatinine, Ser: 0.79 mg/dL (ref 0.50–1.35)
GFR calc Af Amer: >90 mL/min (ref >90)
GFR calc non Af Amer: 88 mL/min — ABNORMAL LOW (ref >90)

## 2012-09-18 LAB — POCT I-STAT GLUCOSE
Glucose, Bld: 147 mg/dL — ABNORMAL HIGH (ref 70–99)
Glucose, Bld: 90 mg/dL (ref 70–99)
Operator id: 284731
Operator id: 3406

## 2012-09-18 LAB — MAGNESIUM: Magnesium: 2.6 mg/dL — ABNORMAL HIGH (ref 1.5–2.5)

## 2012-09-18 LAB — PLATELET COUNT: Platelets: 141 10*3/uL — ABNORMAL LOW (ref 150–400)

## 2012-09-18 SURGERY — CORONARY ARTERY BYPASS GRAFTING (CABG)
Anesthesia: General | Site: Esophagus | Wound class: Clean

## 2012-09-18 MED ORDER — ONDANSETRON HCL 4 MG/2ML IJ SOLN
4.0000 mg | Freq: Four times a day (QID) | INTRAMUSCULAR | Status: DC | PRN
  Administered 2012-09-18 – 2012-09-20 (×2): 4 mg via INTRAVENOUS
  Filled 2012-09-18 (×2): qty 2

## 2012-09-18 MED ORDER — INSULIN ASPART 100 UNIT/ML ~~LOC~~ SOLN
0.0000 [IU] | SUBCUTANEOUS | Status: AC
  Administered 2012-09-18 (×2): 2 [IU] via SUBCUTANEOUS

## 2012-09-18 MED ORDER — MUPIROCIN 2 % EX OINT
1.0000 "application " | TOPICAL_OINTMENT | Freq: Two times a day (BID) | CUTANEOUS | Status: DC
  Filled 2012-09-18: qty 22

## 2012-09-18 MED ORDER — LACTATED RINGERS IV SOLN
INTRAVENOUS | Status: DC | PRN
  Administered 2012-09-18: 07:00:00 via INTRAVENOUS

## 2012-09-18 MED ORDER — SODIUM CHLORIDE 0.9 % IV SOLN
INTRAVENOUS | Status: DC
  Filled 2012-09-18: qty 1

## 2012-09-18 MED ORDER — HEMOSTATIC AGENTS (NO CHARGE) OPTIME
TOPICAL | Status: DC | PRN
  Administered 2012-09-18: 1 via TOPICAL

## 2012-09-18 MED ORDER — NITROGLYCERIN IN D5W 200-5 MCG/ML-% IV SOLN: 0.0000 ug/min | INTRAVENOUS | Status: DC

## 2012-09-18 MED ORDER — SODIUM CHLORIDE 0.9 % IJ SOLN: 3.0000 mL | INTRAMUSCULAR | Status: DC | PRN

## 2012-09-18 MED ORDER — PROPOFOL 10 MG/ML IV BOLUS
INTRAVENOUS | Status: DC | PRN
  Administered 2012-09-18: 70 mg via INTRAVENOUS

## 2012-09-18 MED ORDER — DEXTROSE 5 % IV SOLN
0.0000 ug/min | INTRAVENOUS | Status: DC
  Administered 2012-09-18: 40 ug/min via INTRAVENOUS
  Filled 2012-09-18 (×3): qty 2

## 2012-09-18 MED ORDER — PROTAMINE SULFATE 10 MG/ML IV SOLN
INTRAVENOUS | Status: DC | PRN
  Administered 2012-09-18 (×5): 50 mg via INTRAVENOUS

## 2012-09-18 MED ORDER — BISACODYL 10 MG RE SUPP: 10.0000 mg | Freq: Every day | RECTAL | Status: DC

## 2012-09-18 MED ORDER — SIMVASTATIN 40 MG PO TABS
40.0000 mg | ORAL_TABLET | Freq: Every day | ORAL | Status: DC
  Administered 2012-09-19 – 2012-09-22 (×4): 40 mg via ORAL
  Filled 2012-09-18 (×5): qty 1

## 2012-09-18 MED ORDER — LACTATED RINGERS IV SOLN
INTRAVENOUS | Status: DC | PRN
  Administered 2012-09-18 (×2): via INTRAVENOUS

## 2012-09-18 MED ORDER — OXYCODONE HCL 5 MG PO TABS
5.0000 mg | ORAL_TABLET | ORAL | Status: DC | PRN
  Administered 2012-09-19 (×3): 5 mg via ORAL
  Administered 2012-09-20: 10 mg via ORAL
  Administered 2012-09-20: 5 mg via ORAL
  Administered 2012-09-21 – 2012-09-22 (×4): 10 mg via ORAL
  Filled 2012-09-18 (×3): qty 1
  Filled 2012-09-18 (×5): qty 2
  Filled 2012-09-18: qty 1

## 2012-09-18 MED ORDER — FENTANYL CITRATE 0.05 MG/ML IJ SOLN
25.0000 ug | INTRAMUSCULAR | Status: DC | PRN
  Administered 2012-09-18 – 2012-09-19 (×4): 25 ug via INTRAVENOUS
  Filled 2012-09-18 (×6): qty 2

## 2012-09-18 MED ORDER — MUPIROCIN 2 % EX OINT
1.0000 "application " | TOPICAL_OINTMENT | Freq: Two times a day (BID) | CUTANEOUS | Status: DC
  Administered 2012-09-18 – 2012-09-20 (×5): 1 via NASAL
  Filled 2012-09-18: qty 22

## 2012-09-18 MED ORDER — MAGNESIUM SULFATE 40 MG/ML IJ SOLN
4.0000 g | Freq: Once | INTRAMUSCULAR | Status: AC
  Administered 2012-09-18: 4 g via INTRAVENOUS
  Filled 2012-09-18: qty 100

## 2012-09-18 MED ORDER — ALBUMIN HUMAN 5 % IV SOLN
INTRAVENOUS | Status: DC | PRN
  Administered 2012-09-18 (×2): via INTRAVENOUS

## 2012-09-18 MED ORDER — DOCUSATE SODIUM 100 MG PO CAPS
200.0000 mg | ORAL_CAPSULE | Freq: Every day | ORAL | Status: DC
  Administered 2012-09-19 – 2012-09-22 (×4): 200 mg via ORAL
  Filled 2012-09-18 (×4): qty 2
  Filled 2012-09-18: qty 1
  Filled 2012-09-18: qty 2

## 2012-09-18 MED ORDER — VECURONIUM BROMIDE 10 MG IV SOLR
INTRAVENOUS | Status: DC | PRN
  Administered 2012-09-18 (×4): 5 mg via INTRAVENOUS

## 2012-09-18 MED ORDER — SODIUM CHLORIDE 0.9 % IV SOLN
INTRAVENOUS | Status: DC
Start: 2012-09-18 — End: 2012-09-20

## 2012-09-18 MED ORDER — DEXMEDETOMIDINE HCL IN NACL 200 MCG/50ML IV SOLN
0.1000 ug/kg/h | INTRAVENOUS | Status: DC
  Filled 2012-09-18: qty 50

## 2012-09-18 MED ORDER — CHLORHEXIDINE GLUCONATE 4 % EX LIQD: 30.0000 mL | CUTANEOUS | Status: DC

## 2012-09-18 MED ORDER — DEXTROSE 5 % IV SOLN
1.5000 g | Freq: Two times a day (BID) | INTRAVENOUS | Status: AC
  Administered 2012-09-18 – 2012-09-20 (×4): 1.5 g via INTRAVENOUS
  Filled 2012-09-18 (×4): qty 1.5

## 2012-09-18 MED ORDER — ARTIFICIAL TEARS OP OINT
TOPICAL_OINTMENT | OPHTHALMIC | Status: DC | PRN
  Administered 2012-09-18: 1 via OPHTHALMIC

## 2012-09-18 MED ORDER — PANTOPRAZOLE SODIUM 40 MG PO TBEC
40.0000 mg | DELAYED_RELEASE_TABLET | Freq: Every day | ORAL | Status: DC
  Administered 2012-09-20 – 2012-09-23 (×4): 40 mg via ORAL
  Filled 2012-09-18 (×5): qty 1

## 2012-09-18 MED ORDER — LACTATED RINGERS IV SOLN
INTRAVENOUS | Status: DC | PRN
  Administered 2012-09-18 (×2): via INTRAVENOUS

## 2012-09-18 MED ORDER — INSULIN REGULAR BOLUS VIA INFUSION
0.0000 [IU] | Freq: Three times a day (TID) | INTRAVENOUS | Status: DC
  Filled 2012-09-18: qty 10

## 2012-09-18 MED ORDER — SODIUM CHLORIDE 0.45 % IV SOLN
INTRAVENOUS | Status: DC
Start: 2012-09-18 — End: 2012-09-20

## 2012-09-18 MED ORDER — FAMOTIDINE IN NACL 20-0.9 MG/50ML-% IV SOLN
20.0000 mg | Freq: Two times a day (BID) | INTRAVENOUS | Status: DC
  Administered 2012-09-18: 20 mg via INTRAVENOUS

## 2012-09-18 MED ORDER — GLYCOPYRROLATE 0.2 MG/ML IJ SOLN
INTRAMUSCULAR | Status: DC | PRN
  Administered 2012-09-18 (×2): 0.2 mg via INTRAVENOUS

## 2012-09-18 MED ORDER — LACTATED RINGERS IV SOLN: 500.0000 mL | Freq: Once | INTRAVENOUS | Status: AC | PRN

## 2012-09-18 MED ORDER — DOPAMINE-DEXTROSE 3.2-5 MG/ML-% IV SOLN: 0.0000 ug/kg/min | INTRAVENOUS | Status: DC

## 2012-09-18 MED ORDER — METOPROLOL TARTRATE 12.5 MG HALF TABLET: 12.5000 mg | ORAL_TABLET | Freq: Once | ORAL | Status: DC

## 2012-09-18 MED ORDER — METOPROLOL TARTRATE 1 MG/ML IV SOLN: 2.5000 mg | INTRAVENOUS | Status: DC | PRN

## 2012-09-18 MED ORDER — FENTANYL CITRATE 0.05 MG/ML IJ SOLN
INTRAMUSCULAR | Status: DC | PRN
  Administered 2012-09-18: 150 ug via INTRAVENOUS
  Administered 2012-09-18 (×3): 250 ug via INTRAVENOUS
  Administered 2012-09-18: 200 ug via INTRAVENOUS
  Administered 2012-09-18: 250 ug via INTRAVENOUS
  Administered 2012-09-18: 100 ug via INTRAVENOUS
  Administered 2012-09-18: 50 ug via INTRAVENOUS

## 2012-09-18 MED ORDER — SODIUM CHLORIDE 0.9 % IJ SOLN
3.0000 mL | Freq: Two times a day (BID) | INTRAMUSCULAR | Status: DC
  Administered 2012-09-19 – 2012-09-20 (×3): 3 mL via INTRAVENOUS

## 2012-09-18 MED ORDER — ACETAMINOPHEN 160 MG/5ML PO SOLN: 975.0000 mg | Freq: Four times a day (QID) | ORAL | Status: DC

## 2012-09-18 MED ORDER — ALBUMIN HUMAN 5 % IV SOLN
250.0000 mL | INTRAVENOUS | Status: AC | PRN
  Administered 2012-09-18 (×3): 250 mL via INTRAVENOUS
  Filled 2012-09-18: qty 500

## 2012-09-18 MED ORDER — CHLORHEXIDINE GLUCONATE CLOTH 2 % EX PADS
6.0000 | MEDICATED_PAD | Freq: Every day | CUTANEOUS | Status: DC
  Administered 2012-09-18 – 2012-09-19 (×2): 6 via TOPICAL

## 2012-09-18 MED ORDER — METOPROLOL TARTRATE 12.5 MG HALF TABLET
12.5000 mg | ORAL_TABLET | Freq: Two times a day (BID) | ORAL | Status: DC
  Administered 2012-09-20 – 2012-09-21 (×4): 12.5 mg via ORAL
  Filled 2012-09-18 (×9): qty 1

## 2012-09-18 MED ORDER — SODIUM CHLORIDE 0.9 % IV SOLN
INTRAVENOUS | Status: DC | PRN
  Administered 2012-09-18: 13:00:00 via INTRAVENOUS

## 2012-09-18 MED ORDER — VANCOMYCIN HCL IN DEXTROSE 1-5 GM/200ML-% IV SOLN
1000.0000 mg | Freq: Once | INTRAVENOUS | Status: AC
  Administered 2012-09-18: 1000 mg via INTRAVENOUS
  Filled 2012-09-18 (×2): qty 200

## 2012-09-18 MED ORDER — MIDAZOLAM HCL 5 MG/5ML IJ SOLN
INTRAMUSCULAR | Status: DC | PRN
  Administered 2012-09-18: 3 mg via INTRAVENOUS
  Administered 2012-09-18: 2 mg via INTRAVENOUS
  Administered 2012-09-18: 3 mg via INTRAVENOUS
  Administered 2012-09-18: 4 mg via INTRAVENOUS
  Administered 2012-09-18 (×2): 3 mg via INTRAVENOUS
  Administered 2012-09-18: 2 mg via INTRAVENOUS

## 2012-09-18 MED ORDER — ACETAMINOPHEN 10 MG/ML IV SOLN
1000.0000 mg | Freq: Once | INTRAVENOUS | Status: AC
  Administered 2012-09-18: 1000 mg via INTRAVENOUS
  Filled 2012-09-18: qty 100

## 2012-09-18 MED ORDER — POTASSIUM CHLORIDE 10 MEQ/50ML IV SOLN
10.0000 meq | INTRAVENOUS | Status: AC
  Administered 2012-09-18 (×4): 10 meq via INTRAVENOUS

## 2012-09-18 MED ORDER — SODIUM CHLORIDE 0.9 % IV SOLN: 250.0000 mL | INTRAVENOUS | Status: DC

## 2012-09-18 MED ORDER — BISACODYL 5 MG PO TBEC
10.0000 mg | DELAYED_RELEASE_TABLET | Freq: Every day | ORAL | Status: DC
  Administered 2012-09-19 – 2012-09-20 (×2): 10 mg via ORAL
  Filled 2012-09-18: qty 2
  Filled 2012-09-18: qty 1
  Filled 2012-09-18: qty 2

## 2012-09-18 MED ORDER — HEPARIN SODIUM (PORCINE) 1000 UNIT/ML IJ SOLN
INTRAMUSCULAR | Status: DC | PRN
  Administered 2012-09-18: 4000 [IU] via INTRAVENOUS
  Administered 2012-09-18: 25000 [IU] via INTRAVENOUS
  Administered 2012-09-18: 2000 [IU] via INTRAVENOUS

## 2012-09-18 MED ORDER — ASPIRIN 81 MG PO CHEW: 324.0000 mg | CHEWABLE_TABLET | Freq: Every day | ORAL | Status: DC

## 2012-09-18 MED ORDER — SODIUM CHLORIDE 0.9 % IJ SOLN
OROMUCOSAL | Status: DC | PRN
  Administered 2012-09-18 (×3): via TOPICAL

## 2012-09-18 MED ORDER — 0.9 % SODIUM CHLORIDE (POUR BTL) OPTIME
TOPICAL | Status: DC | PRN
  Administered 2012-09-18: 6000 mL

## 2012-09-18 MED ORDER — MIDAZOLAM HCL 2 MG/2ML IJ SOLN: 2.0000 mg | INTRAMUSCULAR | Status: DC | PRN

## 2012-09-18 MED ORDER — ASPIRIN EC 325 MG PO TBEC
325.0000 mg | DELAYED_RELEASE_TABLET | Freq: Every day | ORAL | Status: DC
  Administered 2012-09-19 – 2012-09-23 (×5): 325 mg via ORAL
  Filled 2012-09-18 (×5): qty 1

## 2012-09-18 MED ORDER — ACETAMINOPHEN 500 MG PO TABS
1000.0000 mg | ORAL_TABLET | Freq: Four times a day (QID) | ORAL | Status: DC
  Administered 2012-09-18 – 2012-09-21 (×11): 1000 mg via ORAL
  Administered 2012-09-21: 500 mg via ORAL
  Administered 2012-09-22 – 2012-09-23 (×5): 1000 mg via ORAL
  Filled 2012-09-18 (×20): qty 2

## 2012-09-18 MED ORDER — METOPROLOL TARTRATE 25 MG/10 ML ORAL SUSPENSION
12.5000 mg | Freq: Two times a day (BID) | ORAL | Status: DC
  Filled 2012-09-18 (×9): qty 5

## 2012-09-18 MED ORDER — LACTATED RINGERS IV SOLN: INTRAVENOUS | Status: DC

## 2012-09-18 MED ORDER — INSULIN ASPART 100 UNIT/ML ~~LOC~~ SOLN
0.0000 [IU] | SUBCUTANEOUS | Status: DC
  Administered 2012-09-19: 2 [IU] via SUBCUTANEOUS

## 2012-09-18 MED ORDER — ROCURONIUM BROMIDE 100 MG/10ML IV SOLN
INTRAVENOUS | Status: DC | PRN
  Administered 2012-09-18: 60 mg via INTRAVENOUS
  Administered 2012-09-18: 40 mg via INTRAVENOUS

## 2012-09-18 SURGICAL SUPPLY — 111 items
ADAPTER CARDIO PERF ANTE/RETRO (ADAPTER) ×4 IMPLANT
ATTRACTOMAT 16X20 MAGNETIC DRP (DRAPES) ×4 IMPLANT
BAG DECANTER FOR FLEXI CONT (MISCELLANEOUS) ×4 IMPLANT
BANDAGE ELASTIC 4 VELCRO ST LF (GAUZE/BANDAGES/DRESSINGS) ×8 IMPLANT
BANDAGE ELASTIC 6 VELCRO ST LF (GAUZE/BANDAGES/DRESSINGS) ×8 IMPLANT
BANDAGE GAUZE ELAST BULKY 4 IN (GAUZE/BANDAGES/DRESSINGS) ×8 IMPLANT
BASKET HEART  (ORDER IN 25'S) (MISCELLANEOUS) ×1
BASKET HEART (ORDER IN 25'S) (MISCELLANEOUS) ×1
BASKET HEART (ORDER IN 25S) (MISCELLANEOUS) ×2 IMPLANT
BLADE STERNUM SYSTEM 6 (BLADE) ×4 IMPLANT
BLADE SURG 12 STRL SS (BLADE) ×4 IMPLANT
BLADE SURG ROTATE 9660 (MISCELLANEOUS) IMPLANT
CANISTER SUCTION 2500CC (MISCELLANEOUS) ×4 IMPLANT
CANNULA AORTIC HI-FLOW 6.5M20F (CANNULA) ×4 IMPLANT
CANNULA GUNDRY RCSP 15FR (MISCELLANEOUS) ×4 IMPLANT
CANNULA VENOUS MAL SGL STG 40 (MISCELLANEOUS) IMPLANT
CANNULAE VENOUS MAL SGL STG 40 (MISCELLANEOUS)
CATH CPB KIT VANTRIGT (MISCELLANEOUS) ×4 IMPLANT
CATH ROBINSON RED A/P 18FR (CATHETERS) ×12 IMPLANT
CATH THORACIC 28FR (CATHETERS) IMPLANT
CATH THORACIC 28FR RT ANG (CATHETERS) IMPLANT
CATH THORACIC 36FR (CATHETERS) IMPLANT
CATH THORACIC 36FR RT ANG (CATHETERS) ×8 IMPLANT
CLIP FOGARTY SPRING 6M (CLIP) ×4 IMPLANT
CLIP TI WIDE RED SMALL 24 (CLIP) ×8 IMPLANT
CLOTH BEACON ORANGE TIMEOUT ST (SAFETY) ×4 IMPLANT
COVER SURGICAL LIGHT HANDLE (MISCELLANEOUS) ×4 IMPLANT
CRADLE DONUT ADULT HEAD (MISCELLANEOUS) ×4 IMPLANT
DRAIN CHANNEL 32F RND 10.7 FF (WOUND CARE) ×4 IMPLANT
DRAPE CARDIOVASCULAR INCISE (DRAPES) ×2
DRAPE SLUSH MACHINE 52X66 (DRAPES) IMPLANT
DRAPE SLUSH/WARMER DISC (DRAPES) ×4 IMPLANT
DRAPE SRG 135X102X78XABS (DRAPES) ×2 IMPLANT
DRSG COVADERM 4X14 (GAUZE/BANDAGES/DRESSINGS) ×4 IMPLANT
ELECT BLADE 4.0 EZ CLEAN MEGAD (MISCELLANEOUS) ×4
ELECT BLADE 6.5 EXT (BLADE) ×4 IMPLANT
ELECT CAUTERY BLADE 6.4 (BLADE) ×4 IMPLANT
ELECT REM PT RETURN 9FT ADLT (ELECTROSURGICAL) ×8
ELECTRODE BLDE 4.0 EZ CLN MEGD (MISCELLANEOUS) ×2 IMPLANT
ELECTRODE REM PT RTRN 9FT ADLT (ELECTROSURGICAL) ×4 IMPLANT
GLOVE BIO SURGEON STRL SZ 6.5 (GLOVE) ×12 IMPLANT
GLOVE BIO SURGEON STRL SZ7.5 (GLOVE) ×16 IMPLANT
GLOVE BIO SURGEON STRL SZ8 (GLOVE) ×8 IMPLANT
GLOVE BIO SURGEONS STRL SZ 6.5 (GLOVE) ×4
GLOVE BIOGEL PI IND STRL 6.5 (GLOVE) ×6 IMPLANT
GLOVE BIOGEL PI IND STRL 7.0 (GLOVE) ×10 IMPLANT
GLOVE BIOGEL PI INDICATOR 6.5 (GLOVE) ×6
GLOVE BIOGEL PI INDICATOR 7.0 (GLOVE) ×10
GOWN PREVENTION PLUS XLARGE (GOWN DISPOSABLE) ×20 IMPLANT
GOWN STRL NON-REIN LRG LVL3 (GOWN DISPOSABLE) ×16 IMPLANT
HEMOSTAT POWDER SURGIFOAM 1G (HEMOSTASIS) ×12 IMPLANT
HEMOSTAT SURGICEL 2X14 (HEMOSTASIS) ×4 IMPLANT
INSERT FOGARTY XLG (MISCELLANEOUS) IMPLANT
KIT BASIN OR (CUSTOM PROCEDURE TRAY) ×4 IMPLANT
KIT ROOM TURNOVER OR (KITS) ×4 IMPLANT
KIT SUCTION CATH 14FR (SUCTIONS) ×4 IMPLANT
KIT VASOVIEW W/TROCAR VH 2000 (KITS) ×4 IMPLANT
LEAD PACING MYOCARDI (MISCELLANEOUS) ×4 IMPLANT
MARKER GRAFT CORONARY BYPASS (MISCELLANEOUS) ×12 IMPLANT
MATRIX HEMOSTAT SURGIFLO (HEMOSTASIS) ×4 IMPLANT
NS IRRIG 1000ML POUR BTL (IV SOLUTION) ×28 IMPLANT
PACK OPEN HEART (CUSTOM PROCEDURE TRAY) ×4 IMPLANT
PAD ARMBOARD 7.5X6 YLW CONV (MISCELLANEOUS) ×8 IMPLANT
PENCIL BUTTON HOLSTER BLD 10FT (ELECTRODE) ×4 IMPLANT
PUNCH AORTIC ROTATE 4.0MM (MISCELLANEOUS) IMPLANT
PUNCH AORTIC ROTATE 4.5MM 8IN (MISCELLANEOUS) ×4 IMPLANT
PUNCH AORTIC ROTATE 5MM 8IN (MISCELLANEOUS) IMPLANT
SET CARDIOPLEGIA MPS 5001102 (MISCELLANEOUS) ×4 IMPLANT
SPONGE GAUZE 4X4 12PLY (GAUZE/BANDAGES/DRESSINGS) ×20 IMPLANT
SPONGE LAP 18X18 X RAY DECT (DISPOSABLE) ×8 IMPLANT
SUT BONE WAX W31G (SUTURE) ×4 IMPLANT
SUT MNCRL AB 4-0 PS2 18 (SUTURE) IMPLANT
SUT PROLENE 3 0 SH DA (SUTURE) IMPLANT
SUT PROLENE 3 0 SH1 36 (SUTURE) IMPLANT
SUT PROLENE 4 0 RB 1 (SUTURE) ×2
SUT PROLENE 4 0 SH DA (SUTURE) ×8 IMPLANT
SUT PROLENE 4-0 RB1 .5 CRCL 36 (SUTURE) ×2 IMPLANT
SUT PROLENE 5 0 C 1 36 (SUTURE) ×4 IMPLANT
SUT PROLENE 6 0 C 1 30 (SUTURE) ×12 IMPLANT
SUT PROLENE 6 0 CC (SUTURE) ×4 IMPLANT
SUT PROLENE 7 0 DA (SUTURE) IMPLANT
SUT PROLENE 7.0 RB 3 (SUTURE) ×16 IMPLANT
SUT PROLENE 8 0 BV175 6 (SUTURE) ×4 IMPLANT
SUT PROLENE BLUE 7 0 (SUTURE) ×12 IMPLANT
SUT SILK  1 MH (SUTURE)
SUT SILK 1 MH (SUTURE) IMPLANT
SUT SILK 1 TIES 10X30 (SUTURE) ×4 IMPLANT
SUT SILK 2 0 SH CR/8 (SUTURE) ×4 IMPLANT
SUT SILK 3 0 SH CR/8 (SUTURE) ×4 IMPLANT
SUT STEEL 6MS V (SUTURE) ×8 IMPLANT
SUT STEEL STERNAL CCS#1 18IN (SUTURE) IMPLANT
SUT STEEL SZ 6 DBL 3X14 BALL (SUTURE) ×4 IMPLANT
SUT VIC AB 1 CTX 18 (SUTURE) ×4 IMPLANT
SUT VIC AB 1 CTX 36 (SUTURE) ×4
SUT VIC AB 1 CTX36XBRD ANBCTR (SUTURE) ×4 IMPLANT
SUT VIC AB 2-0 CT1 27 (SUTURE) ×4
SUT VIC AB 2-0 CT1 TAPERPNT 27 (SUTURE) ×4 IMPLANT
SUT VIC AB 2-0 CTX 27 (SUTURE) IMPLANT
SUT VIC AB 3-0 SH 27 (SUTURE)
SUT VIC AB 3-0 SH 27X BRD (SUTURE) IMPLANT
SUT VIC AB 3-0 X1 27 (SUTURE) ×8 IMPLANT
SUT VICRYL 4-0 PS2 18IN ABS (SUTURE) IMPLANT
SUTURE E-PAK OPEN HEART (SUTURE) ×4 IMPLANT
SYSTEM SAHARA CHEST DRAIN ATS (WOUND CARE) ×4 IMPLANT
TAPE CLOTH SURG 4X10 WHT LF (GAUZE/BANDAGES/DRESSINGS) ×12 IMPLANT
TOWEL OR 17X24 6PK STRL BLUE (TOWEL DISPOSABLE) ×8 IMPLANT
TOWEL OR 17X26 10 PK STRL BLUE (TOWEL DISPOSABLE) ×8 IMPLANT
TRAY FOLEY IC TEMP SENS 14FR (CATHETERS) ×4 IMPLANT
TUBING INSUFFLATION 10FT LAP (TUBING) ×4 IMPLANT
UNDERPAD 30X30 INCONTINENT (UNDERPADS AND DIAPERS) ×4 IMPLANT
WATER STERILE IRR 1000ML POUR (IV SOLUTION) ×8 IMPLANT

## 2012-09-18 NOTE — Preoperative (Addendum)
Beta Blockers   Reason not to administer Beta Blockers:Not Applicable, last dose 09/18/12 at 04:30

## 2012-09-18 NOTE — Anesthesia Postprocedure Evaluation (Signed)
  Anesthesia Post-op Note  Patient: Antonio Villarreal  Procedure(s) Performed: Procedure(s) (LRB) with comments: CORONARY ARTERY BYPASS GRAFTING (CABG) (N/A) - Coronary Artery Bypass graft times four utilizing the left intermal mammary artery and the righ and left greater saphenous veins harvested endoscopically. TRANSESOPHAGEAL ECHOCARDIOGRAM (TEE) (N/A)  Patient Location: SICU  Anesthesia Type: General   Level of Consciousness: sedated and Patient remains intubated per anesthesia plan  Airway and Oxygen Therapy: Patient remains intubated per anesthesia plan  Post-op Pain: none  Post-op Assessment: Post-op Vital signs reviewed, Patient's Cardiovascular Status Stable and Respiratory Function Stable  Post-op Vital Signs: Reviewed and stable  Complications: No apparent anesthesia complications

## 2012-09-18 NOTE — Progress Notes (Signed)
The patient was examined and preop studies reviewed. There has been no change from the prior exam and the patient is ready for surgery.  Plan CABG this am for 3 vessel CAD

## 2012-09-18 NOTE — OR Nursing (Signed)
20 minute call made to 2300 at 1323.

## 2012-09-18 NOTE — Transfer of Care (Signed)
Immediate Anesthesia Transfer of Care Note  Patient: Antonio Villarreal  Procedure(s) Performed: Procedure(s) (LRB) with comments: CORONARY ARTERY BYPASS GRAFTING (CABG) (N/A) - Coronary Artery Bypass graft times four utilizing the left intermal mammary artery and the righ and left greater saphenous veins harvested endoscopically. TRANSESOPHAGEAL ECHOCARDIOGRAM (TEE) (N/A)  Patient Location: SICU  Anesthesia Type:General  Level of Consciousness: sedated and Patient remains intubated per anesthesia plan  Airway & Oxygen Therapy: Patient remains intubated per anesthesia plan and Patient placed on Ventilator (see vital sign flow sheet for setting)  Post-op Assessment: Report given to PACU RN and Post -op Vital signs reviewed and stable  Post vital signs: Reviewed and stable  Complications: No apparent anesthesia complications

## 2012-09-18 NOTE — Anesthesia Preprocedure Evaluation (Addendum)
Anesthesia Evaluation  Patient identified by MRN, date of birth, ID band Patient awake    Reviewed: Allergy & Precautions, H&P , NPO status , Patient's Chart, lab work & pertinent test results, reviewed documented beta blocker date and time   History of Anesthesia Complications Negative for: history of anesthetic complications  Airway Mallampati: II TM Distance: >3 FB Neck ROM: Full    Dental  (+) Edentulous Upper, Edentulous Lower and Dental Advisory Given   Pulmonary shortness of breath and with exertion,  breath sounds clear to auscultation        Cardiovascular hypertension, Pt. on home beta blockers + CAD Rhythm:Regular Rate:Normal     Neuro/Psych    GI/Hepatic   Endo/Other  diabetes, Type 2, Oral Hypoglycemic Agents  Renal/GU      Musculoskeletal   Abdominal   Peds  Hematology   Anesthesia Other Findings   Reproductive/Obstetrics                          Anesthesia Physical Anesthesia Plan  ASA: III  Anesthesia Plan: General   Post-op Pain Management:    Induction: Intravenous  Airway Management Planned:   Additional Equipment: PA Cath and Arterial line  Intra-op Plan:   Post-operative Plan: Post-operative intubation/ventilation  Informed Consent:   Plan Discussed with: CRNA, Anesthesiologist and Surgeon  Anesthesia Plan Comments:        Anesthesia Quick Evaluation

## 2012-09-18 NOTE — Brief Op Note (Signed)
                   [address]            [name] [address]          [phone]    09/18/2012  11:49 AM  PATIENT:  Antonio Villarreal  71 y.o. male  PRE-OPERATIVE DIAGNOSIS:  CAD  POST-OPERATIVE DIAGNOSIS:  Coronary artery disease  PROCEDURE:  Procedure(s): CORONARY ARTERY BYPASS GRAFTING (CABG)X4 LIMA-LAD; SVG-RAMUS; SVG-OM1; SVG-DISTCX TRANSESOPHAGEAL ECHOCARDIOGRAM (TEE) EVH- BILAT THIGHS  SURGEON:  Surgeon(s): Kerin Perna, MD  PHYSICIAN ASSISTANT: WAYNE GOLD PA-C  ANESTHESIA:   general  PATIENT CONDITION:  ICU - intubated and hemodynamically stable.  PRE-OPERATIVE WEIGHT: 88kg   COMPLICATIONS: NO KNOWN

## 2012-09-18 NOTE — Procedures (Signed)
Extubation Procedure Note  Patient Details:   Name: Antonio Villarreal DOB: 1941-04-28 MRN: [id number]   Airway Documentation:     Evaluation  O2 sats: stable throughout and currently acceptable Complications: No apparent complications Patient did tolerate procedure well. Bilateral Breath Sounds: Clear   Yes Pt awake and alert. Extubated per protocol, placed on 3L [place], sat 94%. Positive cuff leak, NIF -20, VC 900, IS 1150, BBS cl. Pt able to vocalize.  [name], [name] 09/18/2012, 5:42 PM

## 2012-09-18 NOTE — Progress Notes (Signed)
S/p CABG x4  Extubated  BP 88/57  Pulse 92  Temp 99.9 F (37.7 C) (Core (Comment))  Resp 15  Wt 194 lb 0.1 oz (88 kg)  SpO2 96%  CI 2.5 on 2 mcg/kg/min dopamine  Good UO  Minimal CT drainage  Doing well early postop

## 2012-09-18 NOTE — Progress Notes (Signed)
  Echocardiogram Echocardiogram Transesophageal has been performed.  Antonio Villarreal 09/18/2012, 9:49 AM

## 2012-09-18 NOTE — Anesthesia Postprocedure Evaluation (Signed)
  Anesthesia Post-op Note  Patient: Antonio Villarreal  Procedure(s) Performed: Procedure(s) (LRB) with comments: CORONARY ARTERY BYPASS GRAFTING (CABG) (N/A) - Coronary Artery Bypass graft times four utilizing the left intermal mammary artery and the righ and left greater saphenous veins harvested endoscopically. TRANSESOPHAGEAL ECHOCARDIOGRAM (TEE) (N/A)  Patient Location: PACU and SICU  Anesthesia Type:General  Level of Consciousness: Patient remains intubated per anesthesia plan  Airway and Oxygen Therapy: Patient remains intubated per anesthesia plan  Post-op Pain: mild  Post-op Assessment: Post-op Vital signs reviewed  Post-op Vital Signs: Reviewed  Complications: No apparent anesthesia complications

## 2012-09-18 NOTE — Anesthesia Procedure Notes (Signed)
Procedure Name: Intubation Date/Time: 09/18/2012 8:03 AM Performed by: Jefm Miles E Pre-anesthesia Checklist: Patient identified, Timeout performed, Emergency Drugs available, Suction available and Patient being monitored Patient Re-evaluated:Patient Re-evaluated prior to inductionOxygen Delivery Method: Circle system utilized Preoxygenation: Pre-oxygenation with 100% oxygen Intubation Type: IV induction Ventilation: Mask ventilation without difficulty Laryngoscope Size: Mac and 3 Grade View: Grade I Tube type: Oral Tube size: 8.5 mm Number of attempts: 1 Airway Equipment and Method: Stylet Placement Confirmation: ETT inserted through vocal cords under direct vision,  breath sounds checked- equal and bilateral and positive ETCO2 Secured at: 23 cm Tube secured with: Tape Dental Injury: Teeth and Oropharynx as per pre-operative assessment

## 2012-09-19 ENCOUNTER — Inpatient Hospital Stay (HOSPITAL_COMMUNITY): Payer: Medicare Other

## 2012-09-19 DIAGNOSIS — E1165 Type 2 diabetes mellitus with hyperglycemia: Secondary | ICD-10-CM

## 2012-09-19 DIAGNOSIS — IMO0001 Reserved for inherently not codable concepts without codable children: Secondary | ICD-10-CM

## 2012-09-19 LAB — POCT I-STAT, CHEM 8
BUN: 19 mg/dL (ref 6–23)
Calcium, Ion: 1.15 mmol/L (ref 1.13–1.30)
Chloride: 98 mEq/L (ref 96–112)
Creatinine, Ser: 1.1 mg/dL (ref 0.50–1.35)
Glucose, Bld: 218 mg/dL — ABNORMAL HIGH (ref 70–99)
HCT: 37 % — ABNORMAL LOW (ref 39.0–52.0)
Hemoglobin: 12.6 g/dL — ABNORMAL LOW (ref 13.0–17.0)
Potassium: 4.2 mEq/L (ref 3.5–5.1)
Sodium: 134 mEq/L — ABNORMAL LOW (ref 135–145)
TCO2: 22 mmol/L (ref 0–100)

## 2012-09-19 LAB — GLUCOSE, CAPILLARY
Glucose-Capillary: 110 mg/dL — ABNORMAL HIGH (ref 70–99)
Glucose-Capillary: 143 mg/dL — ABNORMAL HIGH (ref 70–99)
Glucose-Capillary: 164 mg/dL — ABNORMAL HIGH (ref 70–99)
Glucose-Capillary: 252 mg/dL — ABNORMAL HIGH (ref 70–99)
Glucose-Capillary: 254 mg/dL — ABNORMAL HIGH (ref 70–99)
Glucose-Capillary: 266 mg/dL — ABNORMAL HIGH (ref 70–99)
Glucose-Capillary: 298 mg/dL — ABNORMAL HIGH (ref 70–99)
Glucose-Capillary: 312 mg/dL — ABNORMAL HIGH (ref 70–99)

## 2012-09-19 LAB — BASIC METABOLIC PANEL
BUN: 14 mg/dL (ref 6–23)
Chloride: 105 mEq/L (ref 96–112)
GFR calc Af Amer: >90 mL/min (ref >90)
GFR calc non Af Amer: 86 mL/min — ABNORMAL LOW (ref >90)
Glucose, Bld: 122 mg/dL — ABNORMAL HIGH (ref 70–99)
Potassium: 4 mEq/L (ref 3.5–5.1)
Sodium: 137 mEq/L (ref 135–145)

## 2012-09-19 LAB — CBC
HCT: 28.8 % — ABNORMAL LOW (ref 39.0–52.0)
HCT: 29.3 % — ABNORMAL LOW (ref 39.0–52.0)
Hemoglobin: 9.6 g/dL — ABNORMAL LOW (ref 13.0–17.0)
Hemoglobin: 9.8 g/dL — ABNORMAL LOW (ref 13.0–17.0)
MCH: 29.4 pg (ref 26.0–34.0)
MCHC: 33.4 g/dL (ref 30.0–36.0)
MCV: 88 fL (ref 78.0–100.0)
RDW: 12.9 % (ref 11.5–15.5)
RDW: 13 % (ref 11.5–15.5)
WBC: 9.2 10*3/uL (ref 4.0–10.5)

## 2012-09-19 LAB — CREATININE, SERUM: GFR calc non Af Amer: 81 mL/min — ABNORMAL LOW (ref >90)

## 2012-09-19 LAB — MAGNESIUM: Magnesium: 2.2 mg/dL (ref 1.5–2.5)

## 2012-09-19 MED ORDER — DOPAMINE-DEXTROSE 3.2-5 MG/ML-% IV SOLN
3.0000 ug/kg/min | INTRAVENOUS | Status: DC
  Administered 2012-09-19: 3 ug/kg/min via INTRAVENOUS
  Filled 2012-09-19: qty 250

## 2012-09-19 MED ORDER — PNEUMOCOCCAL VAC POLYVALENT 25 MCG/0.5ML IJ INJ
0.5000 mL | INJECTION | Freq: Once | INTRAMUSCULAR | Status: AC
  Administered 2012-09-22: 0.5 mL via INTRAMUSCULAR
  Filled 2012-09-19: qty 0.5

## 2012-09-19 MED ORDER — INSULIN ASPART 100 UNIT/ML ~~LOC~~ SOLN
4.0000 [IU] | Freq: Three times a day (TID) | SUBCUTANEOUS | Status: DC
  Administered 2012-09-19: 09:00:00 via SUBCUTANEOUS

## 2012-09-19 MED ORDER — SODIUM CHLORIDE 0.9 % IV SOLN
INTRAVENOUS | Status: DC
  Administered 2012-09-19: 2.4 [IU]/h via INTRAVENOUS
  Filled 2012-09-19 (×3): qty 1

## 2012-09-19 MED ORDER — INSULIN GLARGINE 100 UNIT/ML ~~LOC~~ SOLN
18.0000 [IU] | Freq: Every day | SUBCUTANEOUS | Status: DC
  Administered 2012-09-19: 18 [IU] via SUBCUTANEOUS

## 2012-09-19 MED ORDER — INSULIN GLARGINE 100 UNIT/ML ~~LOC~~ SOLN
20.0000 [IU] | Freq: Two times a day (BID) | SUBCUTANEOUS | Status: DC
  Administered 2012-09-19: 20 [IU] via SUBCUTANEOUS

## 2012-09-19 MED ORDER — INSULIN ASPART 100 UNIT/ML ~~LOC~~ SOLN
0.0000 [IU] | SUBCUTANEOUS | Status: DC
  Administered 2012-09-19: 4 [IU] via SUBCUTANEOUS

## 2012-09-19 MED ORDER — TRAMADOL HCL 50 MG PO TABS
50.0000 mg | ORAL_TABLET | Freq: Four times a day (QID) | ORAL | Status: DC | PRN
  Administered 2012-09-19 – 2012-09-22 (×5): 50 mg via ORAL
  Filled 2012-09-19: qty 1
  Filled 2012-09-19: qty 2
  Filled 2012-09-19 (×3): qty 1

## 2012-09-19 MED ORDER — FE FUMARATE-B12-VIT C-FA-IFC PO CAPS
1.0000 | ORAL_CAPSULE | Freq: Three times a day (TID) | ORAL | Status: DC
  Administered 2012-09-19 – 2012-09-23 (×11): 1 via ORAL
  Filled 2012-09-19 (×16): qty 1

## 2012-09-19 MED ORDER — VANCOMYCIN HCL IN DEXTROSE 1-5 GM/200ML-% IV SOLN
1000.0000 mg | Freq: Once | INTRAVENOUS | Status: AC
  Administered 2012-09-19: 1000 mg via INTRAVENOUS
  Filled 2012-09-19: qty 200

## 2012-09-19 MED ORDER — POTASSIUM CHLORIDE 10 MEQ/50ML IV SOLN
10.0000 meq | Freq: Once | INTRAVENOUS | Status: AC
  Administered 2012-09-19: 10 meq via INTRAVENOUS

## 2012-09-19 MED ORDER — FUROSEMIDE 10 MG/ML IJ SOLN
20.0000 mg | Freq: Every day | INTRAMUSCULAR | Status: DC
  Administered 2012-09-19 – 2012-09-21 (×2): 20 mg via INTRAVENOUS
  Filled 2012-09-19 (×4): qty 2

## 2012-09-19 MED ORDER — ALBUMIN HUMAN 5 % IV SOLN
12.5000 g | Freq: Once | INTRAVENOUS | Status: AC
  Administered 2012-09-19: 12.5 g via INTRAVENOUS
  Filled 2012-09-19: qty 250

## 2012-09-19 MED FILL — Potassium Chloride Inj 2 mEq/ML: INTRAVENOUS | Qty: 40 | Status: AC

## 2012-09-19 MED FILL — Magnesium Sulfate Inj 50%: INTRAMUSCULAR | Qty: 10 | Status: AC

## 2012-09-19 NOTE — Progress Notes (Signed)
[facility] BRIEF SICU PROGRESS NOTE  1 Day Post-Op  S/P Procedure(s) (LRB): CORONARY ARTERY BYPASS GRAFTING (CABG) (N/A) TRANSESOPHAGEAL ECHOCARDIOGRAM (TEE) (N/A)   Stable day NSR w/ stable BP O2 sats 94% UOP adequate CBG's trending up - now back on insulin drip  Plan: Will give extra dose lantus insulin tonight  OWEN,CLARENCE H 09/19/2012 8:12 PM

## 2012-09-19 NOTE — Progress Notes (Signed)
1 Day Post-Op Procedure(s) (LRB): CORONARY ARTERY BYPASS GRAFTING (CABG) (N/A) TRANSESOPHAGEAL ECHOCARDIOGRAM (TEE) (N/A) Subjective Status post CABG x4, poorly controlled diabetic with hemoglobin A1c 10.2 Normal sinus rhythm, off pressors, chest x-ray minimal atelectasis Blood glucose 100 140 off IV insulin drip will resume Lantus plus sliding scale insulin  Objective: Vital signs in last 24 hours: Temp:  [95.7 F (35.4 C)-99.9 F (37.7 C)] 99.3 F (37.4 C) (10/29 0830) Pulse Rate:  [72-100] 89  (10/29 0945) Cardiac Rhythm:  [-] Normal sinus rhythm (10/29 0900) Resp:  [10-23] 17  (10/29 0945) BP: (87-132)/(45-74) 104/55 mmHg (10/29 0900) SpO2:  [94 %-100 %] 96 % (10/29 0945) Arterial Line BP: (86-164)/(47-83) 125/63 mmHg (10/29 0915) FiO2 (%):  [40 %-50.2 %] 40.3 % (10/28 1730) Weight:  [199 lb 8.3 oz (90.5 kg)] 199 lb 8.3 oz (90.5 kg) (10/29 0500)  Hemodynamic parameters for last 24 hours: PAP: (21-54)/(3-35) 38/21 mmHg CO:  [3.4 L/min-7.3 L/min] 6.4 L/min CI:  [1.7 L/min/m2-3.6 L/min/m2] 3.1 L/min/m2  Intake/Output from previous day: 10/28 0701 - 10/29 0700 In: 6890.3 [I.V.:4356.3; Blood:500; NG/GT:30; IV Piggyback:2004] Out: 9880 [Urine:7495; Blood:1705; Chest Tube:680] Intake/Output this shift: Total I/O In: 845 [P.O.:450; I.V.:95; IV Piggyback:300] Out: 300 [Urine:150; Chest Tube:150]  Exam Alert and comfortable Breath sounds clear Extremities warm, well-perfused  Lab Results:  Basename 09/19/12 0415 09/18/12 2023 09/18/12 2015  WBC 9.2 -- 11.7*  HGB 9.6* 10.2* --  HCT 28.8* 30.0* --  PLT 147* -- 152   BMET:  Basename 09/19/12 0415 09/18/12 2023  NA 137 139  K 4.0 4.3  CL 105 105  CO2 24 --  GLUCOSE 122* 163*  BUN 14 13  CREATININE 0.83 1.00  CALCIUM 8.1* --    PT/INR:  Basename 09/18/12 1418  LABPROT 16.4*  INR 1.35   ABG    Component Value Date/Time   PHART 7.347* 09/18/2012 1834   HCO3 23.8 09/18/2012 1834   TCO2 20 09/18/2012 2023   ACIDBASEDEF 2.0 09/18/2012 1834   O2SAT 93.0 09/18/2012 1834   CBG (last 3)   Basename 09/19/12 0809 09/19/12 0332 09/19/12 0005  GLUCAP 164* 110* 143*    Assessment/Plan: S/P Procedure(s) (LRB): CORONARY ARTERY BYPASS GRAFTING (CABG) (N/A) TRANSESOPHAGEAL ECHOCARDIOGRAM (TEE) (N/A) See progression orders   LOS: 1 day    VAN TRIGT III,Oren Barella 09/19/2012

## 2012-09-19 NOTE — Care Management Note (Signed)
    Page 1 of 1   09/19/2012     3:16:54 PM   CARE MANAGEMENT NOTE 09/19/2012  Patient:  Antonio Villarreal, Antonio Villarreal   Account Number:  [id number]  Date Initiated:  09/19/2012  Documentation initiated by:  Avie Arenas  Subjective/Objective Assessment:   Post op CABG x4.     Action/Plan:   Anticipated DC Date:  09/25/2012   Anticipated DC Plan:  HOME W HOME HEALTH SERVICES      DC Planning Services  CM consult      Choice offered to / List presented to:             Status of service:  In process, will continue to follow Medicare Important Message given?   (If response is "NO", the following Medicare IM given date fields will be blank) Date Medicare IM given:   Date Additional Medicare IM given:    Discharge Disposition:    Per UR Regulation:  Reviewed for med. necessity/level of care/duration of stay  If discussed at Long Length of Stay Meetings, dates discussed:    Comments:  09-19-12 3:15pm Avie Arenas, RNBSN [phone] Has wife - progressing well - will continue to follow for discharge needs.

## 2012-09-19 NOTE — Op Note (Signed)
NAMEMarland Kitchen  YOREL, REDDER NO.:  [id number]  MEDICAL RECORD NO.:  [id number]  LOCATION:  2310                         FACILITY:  [facility]  PHYSICIAN:  Kerin Perna, M.D.  DATE OF BIRTH:  [birth year]  DATE OF PROCEDURE:  09/18/2012 DATE OF DISCHARGE:                              OPERATIVE REPORT   OPERATIONS: 1. Coronary artery bypass grafting x4 (left internal mammary artery     left anterior descending, saphenous vein graft to obtuse marginal     1, saphenous vein graft to distal circumflex posterolateral,     saphenous vein graft to ramus intermediate). 2. Endoscopic harvest of bilateral greater saphenous vein.  PREOPERATIVE DIAGNOSES:  Class 3 progressive angina, severe multivessel coronary artery disease.  POSTOPERATIVE DIAGNOSES:  Class 3 progressive angina, severe multivessel coronary artery disease.  SURGEON:  Kerin Perna, MD  ASSISTANT:  Rowe Clack, PA-C  ANESTHESIA:  General by Dr. Kipp Brood.  CLINICAL NOTE:  The patient is a 71 year old with a history of progressive dyspnea on exertion and a positive stress test.  Cardiac catheterization demonstrated severe multivessel coronary artery disease with fairly well-preserved LV function.  Transvalvular gradient across the aortic valve was minimal.  A 2D echo showed mild aortic stenosis. This was confirmed on TEE at the time of surgery.  The patient was scheduled for surgery, but on his initial presentation for operation his blood sugar was almost 400, so the surgery was canceled and he was placed back on diabetic medications and was evaluated by his diabetic specialist at the [facility].  He was started on Lantus insulin with better blood sugar control and then rescheduled for surgery.  Prior to surgery, I discussed the indications, benefits, alternatives, and risks of bypass surgery with the patient's wife.  He understood the risks of MI, stroke, bleeding, infection, and death.  His  coronaries had a diabetic pattern of disease and were graftable with not optimal targets.  OPERATIVE PROCEDURE:  The patient was brought to the operating room and placed supine on the operating room table.  General anesthesia was induced.  A transesophageal 2D echo probe was placed by the anesthesiologist which showed fairly normal LV systolic function.  There was mild aortic valve calcification and mild aortic stenosis and insufficiency.  The aortic valve was tricuspid.  The patient was prepped and draped as a sterile field.  A sternal incision was made and  the internal mammary artery was harvested.  All the vein was being endoscopically harvested from both legs.  The sternal retractor was placed and pericardium was opened and suspended.  Pursestrings were placed in the ascending aorta and right atrium.  After the vein was harvested the patient was heparinized, ACT was documented as being therapeutic, and the patient was cannulated and placed on cardiopulmonary bypass.  The coronary arteries were identified for grafting.  The coronary arteries were diabetic-type vessels, suboptimal, graftable.  The vein and mammary artery were adequate quality. Cardioplegia cannulas were placed both antegrade and retrograde cold blood cardioplegia and the patient was cooled to 32 degrees.  The aortic crossclamp was applied.  One liter of cold blood cardioplegia was delivered in split doses between the  antegrade aortic, retrograde coronary sinus catheters.  There was good cardioplegic arrest.  Topical hypothermia was also used.  The distal coronary anastomoses were then performed.  The first distal anastomosis was to the ramus intermediate.  This was intramyocardial. It had a proximal 80% stenosis.  A reverse saphenous vein was sewn end- to-side with running 7-0 Prolene with good flow through the graft.  The second distal anastomosis was to the OM-1 branch of the circumflex. This was a 1.5-mm vessel.   A reverse saphenous vein was sewn end-to-side with running 7-0 Prolene with good flow through graft.  Cardioplegia redosed.  The third distal anastomosis was the distal circumflex posterolateral.  This was a smaller 1.1-mm vessel.  There was a proximal 80% stenosis.  A reverse saphenous vein sewn end-to-side with running 7- 0 Prolene with good flow through graft.  Cardioplegia was redosed.  The fourth distal anastomosis was the distal third of the LAD.  It had diffuse disease.  The anastomosis was placed just distal to a high-grade 80-90% stenosis.  The left IMA was a 1.5-mm vessel and was sewn end-to- side with running 8-0 Prolene.  There was good flow through the graft. The pedicle was secured to epicardium and the bulldog was applied to the pedicle.  Cardioplegia was redosed while 3 proximal vein anastomoses were performed on the ascending aorta with a 4.5-mm punch running 7-0 Prolene.  Air was vented from the coronaries with a dose of retrograde warm blood cardioplegia prior to releasing the crossclamp.  Crossclamp was removed and the proximal anastomosis was tied.  Heart resumed a spontaneous rhythm and the grafts were de-aired and opened and each had good flow and hemostasis was documented at the proximal distal anastomoses.  The cardioplegia cannula was removed and temporary pacing wires were applied.  The patient was rewarmed to 37 degrees.  The lungs were expanded and ventilator was resumed.  The patient was weaned off bypass without difficulty.  The echo showed preserved LV systolic function.  The patient was is in a sinus rhythm, nonpaced.  Protamine was administered without adverse reaction.  The platelet count was 140,000.  Cannulas were removed.  The mediastinum was irrigated. Hemostasis was carefully obtained.  The superior pericardial fat was closed over the aorta.  Anterior mediastinal and left pleural chest tube were placed and brought out through separate incisions.   The sternum was closed with interrupted steel wire.  The pectoralis fascia was closed with Vicryl.  The subcutaneous and skin layers were closed in running Vicryl and sterile dressings were applied.  Total cardiopulmonary bypass time was 120 minutes.     Kerin Perna, M.D.     PV/MEDQ  D:  09/18/2012  T:  09/19/2012  Job:  [id number]  cc:   Arnoldo Hooker, MD

## 2012-09-20 ENCOUNTER — Inpatient Hospital Stay (HOSPITAL_COMMUNITY): Payer: Medicare Other

## 2012-09-20 LAB — BASIC METABOLIC PANEL
BUN: 18 mg/dL (ref 6–23)
CO2: 28 mEq/L (ref 19–32)
Calcium: 8.3 mg/dL — ABNORMAL LOW (ref 8.4–10.5)
Chloride: 99 mEq/L (ref 96–112)
Creatinine, Ser: 0.79 mg/dL (ref 0.50–1.35)
GFR calc Af Amer: >90 mL/min (ref >90)
GFR calc non Af Amer: 88 mL/min — ABNORMAL LOW (ref >90)
Glucose, Bld: 79 mg/dL (ref 70–99)
Potassium: 3.6 mEq/L (ref 3.5–5.1)
Sodium: 133 mEq/L — ABNORMAL LOW (ref 135–145)

## 2012-09-20 LAB — CBC
HCT: 27.9 % — ABNORMAL LOW (ref 39.0–52.0)
Hemoglobin: 9.4 g/dL — ABNORMAL LOW (ref 13.0–17.0)
MCH: 29.7 pg (ref 26.0–34.0)
MCHC: 33.7 g/dL (ref 30.0–36.0)
MCV: 88 fL (ref 78.0–100.0)
Platelets: 137 10*3/uL — ABNORMAL LOW (ref 150–400)
RBC: 3.17 MIL/uL — ABNORMAL LOW (ref 4.22–5.81)
RDW: 12.8 % (ref 11.5–15.5)
WBC: 10.7 10*3/uL — ABNORMAL HIGH (ref 4.0–10.5)

## 2012-09-20 LAB — GLUCOSE, CAPILLARY
Glucose-Capillary: 100 mg/dL — ABNORMAL HIGH (ref 70–99)
Glucose-Capillary: 107 mg/dL — ABNORMAL HIGH (ref 70–99)
Glucose-Capillary: 116 mg/dL — ABNORMAL HIGH (ref 70–99)
Glucose-Capillary: 119 mg/dL — ABNORMAL HIGH (ref 70–99)
Glucose-Capillary: 137 mg/dL — ABNORMAL HIGH (ref 70–99)
Glucose-Capillary: 175 mg/dL — ABNORMAL HIGH (ref 70–99)
Glucose-Capillary: 216 mg/dL — ABNORMAL HIGH (ref 70–99)
Glucose-Capillary: 235 mg/dL — ABNORMAL HIGH (ref 70–99)
Glucose-Capillary: 78 mg/dL (ref 70–99)
Glucose-Capillary: 82 mg/dL (ref 70–99)
Glucose-Capillary: 90 mg/dL (ref 70–99)
Glucose-Capillary: 90 mg/dL (ref 70–99)
Glucose-Capillary: 97 mg/dL (ref 70–99)
Glucose-Capillary: 138 mg/dL — ABNORMAL HIGH (ref 70–99)
Glucose-Capillary: 203 mg/dL — ABNORMAL HIGH (ref 70–99)
Glucose-Capillary: 173 mg/dL — ABNORMAL HIGH (ref 70–99)
Glucose-Capillary: 196 mg/dL — ABNORMAL HIGH (ref 70–99)
Glucose-Capillary: 202 mg/dL — ABNORMAL HIGH (ref 70–99)

## 2012-09-20 MED ORDER — MOVING RIGHT ALONG BOOK
Freq: Once | Status: DC
  Filled 2012-09-20: qty 1

## 2012-09-20 MED ORDER — SODIUM CHLORIDE 0.9 % IV SOLN: 250.0000 mL | INTRAVENOUS | Status: DC | PRN

## 2012-09-20 MED ORDER — SODIUM CHLORIDE 0.9 % IJ SOLN: 3.0000 mL | INTRAMUSCULAR | Status: DC | PRN

## 2012-09-20 MED ORDER — INSULIN ASPART 100 UNIT/ML ~~LOC~~ SOLN
0.0000 [IU] | SUBCUTANEOUS | Status: DC
  Administered 2012-09-20: 4 [IU] via SUBCUTANEOUS

## 2012-09-20 MED ORDER — INSULIN ASPART 100 UNIT/ML ~~LOC~~ SOLN: 0.0000 [IU] | Freq: Three times a day (TID) | SUBCUTANEOUS | Status: DC

## 2012-09-20 MED ORDER — BISACODYL 5 MG PO TBEC: 10.0000 mg | DELAYED_RELEASE_TABLET | Freq: Every day | ORAL | Status: DC | PRN

## 2012-09-20 MED ORDER — POTASSIUM CHLORIDE CRYS ER 20 MEQ PO TBCR
20.0000 meq | EXTENDED_RELEASE_TABLET | Freq: Every day | ORAL | Status: DC
  Administered 2012-09-20 – 2012-09-23 (×4): 20 meq via ORAL
  Filled 2012-09-20 (×3): qty 1

## 2012-09-20 MED ORDER — BISACODYL 10 MG RE SUPP: 10.0000 mg | Freq: Every day | RECTAL | Status: DC | PRN

## 2012-09-20 MED ORDER — INSULIN ASPART 100 UNIT/ML ~~LOC~~ SOLN
0.0000 [IU] | Freq: Every day | SUBCUTANEOUS | Status: DC
  Administered 2012-09-20 – 2012-09-22 (×2): 2 [IU] via SUBCUTANEOUS

## 2012-09-20 MED ORDER — GUAIFENESIN ER 600 MG PO TB12
600.0000 mg | ORAL_TABLET | Freq: Two times a day (BID) | ORAL | Status: DC | PRN
  Filled 2012-09-20: qty 1

## 2012-09-20 MED ORDER — POTASSIUM CHLORIDE 10 MEQ/50ML IV SOLN
10.0000 meq | INTRAVENOUS | Status: AC
  Administered 2012-09-20 (×3): 10 meq via INTRAVENOUS
  Filled 2012-09-20 (×3): qty 50

## 2012-09-20 MED ORDER — INSULIN GLARGINE 100 UNIT/ML ~~LOC~~ SOLN
25.0000 [IU] | Freq: Two times a day (BID) | SUBCUTANEOUS | Status: DC
  Administered 2012-09-20 – 2012-09-23 (×7): 25 [IU] via SUBCUTANEOUS

## 2012-09-20 MED ORDER — MAGNESIUM HYDROXIDE 400 MG/5ML PO SUSP: 30.0000 mL | Freq: Every day | ORAL | Status: DC | PRN

## 2012-09-20 MED ORDER — DOCUSATE SODIUM 100 MG PO CAPS
200.0000 mg | ORAL_CAPSULE | Freq: Every day | ORAL | Status: DC
  Administered 2012-09-20 – 2012-09-23 (×4): 200 mg via ORAL
  Filled 2012-09-20 (×3): qty 2

## 2012-09-20 MED ORDER — INSULIN ASPART 100 UNIT/ML ~~LOC~~ SOLN
6.0000 [IU] | Freq: Three times a day (TID) | SUBCUTANEOUS | Status: DC
  Administered 2012-09-21 – 2012-09-22 (×5): 6 [IU] via SUBCUTANEOUS

## 2012-09-20 MED ORDER — FUROSEMIDE 40 MG PO TABS
40.0000 mg | ORAL_TABLET | Freq: Every day | ORAL | Status: DC
  Administered 2012-09-21 – 2012-09-22 (×2): 40 mg via ORAL
  Filled 2012-09-20 (×3): qty 1

## 2012-09-20 MED ORDER — INSULIN ASPART 100 UNIT/ML ~~LOC~~ SOLN
0.0000 [IU] | SUBCUTANEOUS | Status: AC
  Administered 2012-09-20: 2 [IU] via SUBCUTANEOUS
  Administered 2012-09-20: 8 [IU] via SUBCUTANEOUS

## 2012-09-20 MED ORDER — SODIUM CHLORIDE 0.9 % IJ SOLN
3.0000 mL | Freq: Two times a day (BID) | INTRAMUSCULAR | Status: DC
  Administered 2012-09-20 – 2012-09-22 (×4): 3 mL via INTRAVENOUS

## 2012-09-20 MED ORDER — INSULIN ASPART 100 UNIT/ML ~~LOC~~ SOLN
0.0000 [IU] | Freq: Three times a day (TID) | SUBCUTANEOUS | Status: DC
  Administered 2012-09-21 – 2012-09-22 (×4): 4 [IU] via SUBCUTANEOUS
  Administered 2012-09-22: 3 [IU] via SUBCUTANEOUS

## 2012-09-20 MED ORDER — INSULIN GLARGINE 100 UNIT/ML ~~LOC~~ SOLN: 25.0000 [IU] | Freq: Every day | SUBCUTANEOUS | Status: DC

## 2012-09-20 NOTE — Progress Notes (Signed)
Urine output decreased 0-15/hr x2 hours. BP 90/49 HR 80s. Dr Cornelius Moras made aware. Orders received. Will continue to monitor.

## 2012-09-20 NOTE — Progress Notes (Signed)
Antonio Villarreal was AAO, vital signs were WNL and he was maintaining sats >92% on room air. Pt transferred to 2016 and report given to [place], Charity fundraiser.

## 2012-09-20 NOTE — Progress Notes (Addendum)
[facility] DAILY PROGRESS NOTE                   [address]            Antonio Villarreal [address]          [phone]      2 Days Post-Op Procedure(s) (LRB): CORONARY ARTERY BYPASS GRAFTING (CABG) (N/A) TRANSESOPHAGEAL ECHOCARDIOGRAM (TEE) (N/A)  Total Length of Stay:  LOS: 2 days   Subjective: Some soreness, but overall feeling reasonably well   Objective: Vital signs in last 24 hours: Temp:  [98.1 F (36.7 C)-99.3 F (37.4 C)] 98.5 F (36.9 C) (10/30 0757) Pulse Rate:  [80-141] 95  (10/30 0700) Cardiac Rhythm:  [-] Normal sinus rhythm (10/30 0400) Resp:  [12-21] 15  (10/30 0700) BP: (89-130)/(44-77) 97/61 mmHg (10/30 0700) SpO2:  [89 %-99 %] 95 % (10/30 0700) Arterial Line BP: (121-134)/(61-72) 125/63 mmHg (10/29 0915) Weight:  [199 lb 8.3 oz (90.5 kg)-199 lb 11.8 oz (90.6 kg)] 199 lb 11.8 oz (90.6 kg) (10/30 0600)  Filed Weights   09/19/12 0500 09/19/12 1000 09/20/12 0600  Weight: 199 lb 8.3 oz (90.5 kg) 199 lb 8.3 oz (90.5 kg) 199 lb 11.8 oz (90.6 kg)    Weight change: 0 lb (0 kg)   Hemodynamic parameters for last 24 hours: PAP: (33-38)/(18-21) 38/21 mmHg  Intake/Output from previous day: 10/29 0701 - 10/30 0700 In: 1767.9 [P.O.:650; I.V.:603.9; IV Piggyback:504] Out: 2525 [Urine:2265; Chest Tube:260]  Intake/Output this shift:    Current Meds: Scheduled Meds:   . acetaminophen  1,000 mg Oral Q6H  . albumin human  12.5 g Intravenous Once  . aspirin EC  325 mg Oral Daily  . bisacodyl  10 mg Oral Daily   Or  . bisacodyl  10 mg Rectal Daily  . cefUROXime (ZINACEF)  IV  1.5 g Intravenous Q12H  . Chlorhexidine Gluconate Cloth  6 each Topical Daily  . docusate sodium  200 mg Oral Daily  . ferrous fumarate-b12-vitamic C-folic acid  1 capsule Oral TID PC  . furosemide  20 mg Intravenous Daily  . insulin aspart  0-24 Units Subcutaneous Q2H   Followed by  . insulin aspart  0-24 Units Subcutaneous Q4H  . insulin glargine  20 Units Subcutaneous BID    . metoprolol tartrate  12.5 mg Oral BID   Or  . metoprolol tartrate  12.5 mg Per Tube BID  . mupirocin ointment  1 application Nasal BID  . pantoprazole  40 mg Oral Daily  . pneumococcal 23 valent vaccine  0.5 mL Intramuscular Once  . potassium chloride  10 mEq Intravenous Once  . potassium chloride  10 mEq Intravenous Q1 Hr x 3  . simvastatin  40 mg Oral q1800  . sodium chloride  3 mL Intravenous Q12H  . vancomycin  1,000 mg Intravenous Once  . DISCONTD: insulin aspart  0-24 Units Subcutaneous Q4H  . DISCONTD: insulin aspart  4 Units Subcutaneous TID WC  . DISCONTD: insulin glargine  18 Units Subcutaneous Daily   Continuous Infusions:   . sodium chloride    . sodium chloride    . sodium chloride    . DOPamine 3 mcg/kg/min (09/19/12 2248)  . insulin (NOVOLIN-R) infusion Stopped (09/20/12 0500)  . phenylephrine (NEO-SYNEPHRINE) Adult infusion Stopped (09/19/12 0830)   PRN Meds:.albumin human, fentaNYL, metoprolol, midazolam, ondansetron (ZOFRAN) IV, oxyCODONE, sodium chloride, traMADol  General appearance: alert, cooperative and no distress Heart: regular rate and rhythm and soft systolic murmur  Lungs: dim in bases Abdomen: +BS, soft, nontender Extremities: mild LE edema Wound: dressings CDI  Lab Results: CBC: Basename 09/20/12 0400 09/19/12 1701 09/19/12 1649  WBC 10.7* -- 10.8*  HGB 9.4* 12.6* --  HCT 27.9* 37.0* --  PLT 137* -- 138*   BMET:  Basename 09/20/12 0400 09/19/12 1701 09/19/12 0415  NA 133* 134* --  K 3.6 4.2 --  CL 99 98 --  CO2 28 -- 24  GLUCOSE 79 218* --  BUN 18 19 --  CREATININE 0.79 1.10 --  CALCIUM 8.3* -- 8.1*    PT/INR:  Basename 09/18/12 1418  LABPROT 16.4*  INR 1.35   Radiology: Dg Chest Portable 1 View In Am  09/19/2012  *RADIOLOGY REPORT*  Clinical Data: Status post CABG 1 day ago.  Slight chest discomfort.  PORTABLE CHEST - 1 VIEW  Comparison: 1 day prior  Findings: Extubation and removal of nasogastric tube.  The right IJ  Swan-Ganz catheter unchanged with tip at pulmonary outflow tract. Mediastinal drain.  Left-sided chest tube.  Midline trachea.  Mild cardiomegaly.  Possible small left pleural effusion. No pneumothorax.  Low lung volumes.  Patchy left base atelectasis.  Resolution of mild pulmonary venous congestion.  IMPRESSION:  1.  Extubation and removal of nasogastric tube. 2.  Stable appearance of remaining support apparatus. 3.  Resolved pulmonary venous congestion with mild left base atelectasis.   Original Report Authenticated By: Consuello Bossier, M.D.    Dg Chest Portable 1 View  09/18/2012  *RADIOLOGY REPORT*  Clinical Data: .  CABG.  PORTABLE CHEST - 1 VIEW  Comparison: 09/05/2012  Findings: Changes of CABG.  Endotracheal tube is 3 cm above the carina.  Swan-Ganz catheter is in the main pulmonary artery.  The NG tube enters the stomach.  Left chest tube in place.  No pneumothorax.  Low lung volumes with bibasilar atelectasis.  IMPRESSION: Postoperative changes as above.  No visible pneumothorax.   Original Report Authenticated By: Cyndie Chime, M.D.      Assessment/Plan: S/P Procedure(s) (LRB): CORONARY ARTERY BYPASS GRAFTING (CABG) (N/A) TRANSESOPHAGEAL ECHOCARDIOGRAM (TEE) (N/A)  1. Doing well 2 EXP ABL anemia, cont to monitor 3 mild hyponatremia, monitor 4  Renal funx - good, poss wean dopamine off at this point, excellent UO, d/c foley  5 chest tube 260cc/24 hrs- D/C 6 pulm toilet for atx 7 CRI as able 8 poss tx to 2000 GOLD,Antonio Villarreal 8:02 AM    Doing well POD 2 Wean dopamine and transfer to stepdown Glucose control has been a problem with re-initiation of insulin drip req yesterday, ask for DM consult

## 2012-09-20 NOTE — Progress Notes (Signed)
CARDIAC REHAB PHASE I   PRE:  Rate/Rhythm: 89SR  BP:  Supine:   Sitting: 100/60  Standing:    SaO2: 92%RA  MODE:  Ambulation: 350 ft   POST:  Rate/Rhythem: 112ST  BP:  Supine:   Sitting: 122/70  Standing:    SaO2: 94%RA 1335-1406 Pt walked 350 ft on RA with rolling walker and minimal asst. Tolerated well. To recliner after walk. Call bell in reach. Second walk today.  Duanne Limerick

## 2012-09-21 ENCOUNTER — Inpatient Hospital Stay (HOSPITAL_COMMUNITY): Payer: Medicare Other

## 2012-09-21 ENCOUNTER — Other Ambulatory Visit: Payer: Self-pay

## 2012-09-21 ENCOUNTER — Encounter (HOSPITAL_COMMUNITY): Payer: Self-pay | Admitting: Cardiothoracic Surgery

## 2012-09-21 LAB — BASIC METABOLIC PANEL
BUN: 20 mg/dL (ref 6–23)
CO2: 26 mEq/L (ref 19–32)
Calcium: 8.4 mg/dL (ref 8.4–10.5)
Chloride: 98 mEq/L (ref 96–112)
Creatinine, Ser: 0.85 mg/dL (ref 0.50–1.35)
GFR calc Af Amer: >90 mL/min (ref >90)
GFR calc non Af Amer: 85 mL/min — ABNORMAL LOW (ref >90)
Glucose, Bld: 195 mg/dL — ABNORMAL HIGH (ref 70–99)
Potassium: 4.3 mEq/L (ref 3.5–5.1)
Sodium: 133 mEq/L — ABNORMAL LOW (ref 135–145)

## 2012-09-21 LAB — CBC
HCT: 27.8 % — ABNORMAL LOW (ref 39.0–52.0)
Hemoglobin: 9.2 g/dL — ABNORMAL LOW (ref 13.0–17.0)
MCH: 29.1 pg (ref 26.0–34.0)
MCHC: 33.1 g/dL (ref 30.0–36.0)
MCV: 88 fL (ref 78.0–100.0)
Platelets: 133 10*3/uL — ABNORMAL LOW (ref 150–400)
RBC: 3.16 MIL/uL — ABNORMAL LOW (ref 4.22–5.81)
RDW: 12.7 % (ref 11.5–15.5)
WBC: 10.6 10*3/uL — ABNORMAL HIGH (ref 4.0–10.5)

## 2012-09-21 LAB — GLUCOSE, CAPILLARY
Glucose-Capillary: 198 mg/dL — ABNORMAL HIGH (ref 70–99)
Glucose-Capillary: 186 mg/dL — ABNORMAL HIGH (ref 70–99)
Glucose-Capillary: 189 mg/dL — ABNORMAL HIGH (ref 70–99)
Glucose-Capillary: 160 mg/dL — ABNORMAL HIGH (ref 70–99)
Glucose-Capillary: 179 mg/dL — ABNORMAL HIGH (ref 70–99)

## 2012-09-21 MED ORDER — LIVING WELL WITH DIABETES BOOK
Freq: Once | Status: AC
  Administered 2012-09-21: 22:00:00
  Filled 2012-09-21: qty 1

## 2012-09-21 NOTE — Progress Notes (Signed)
Inpatient Diabetes Program Recommendations  AACE/ADA: New Consensus Statement on Inpatient Glycemic Control (2013)  Target Ranges:  Prepandial:   less than 140 mg/dL      Peak postprandial:   less than 180 mg/dL (1-2 hours)      Critically ill patients:  140 - 180 mg/dL   Reason for Visit: Consult Spoke with patient regarding history of diabetes and glucose control. Pt states he could not take the metformin he started on as it made him feel badly. States a doctor at [place] started him on insulin, and he has had good control since then. He has not had prior education, therefore I ordered him OP education, in-pt ed via system network videos, RD consult, ADA booklet. Spoke with him about the role of insulin, how to treat low glucose, portion control, etc. Pt should do well on present insulin regimen.  Explained to him how to adjust lantus should his fasting cbg's drop less than 100 mg/dL. Pt has great outlook and should do well. OP [facility] has already called patient with appt for OP education in November.   Note: Thank you, Lenor Coffin, RN, CNS, Diabetes Coordinator [phone])

## 2012-09-21 NOTE — Progress Notes (Addendum)
3 Days Post-Op Procedure(s) (LRB): CORONARY ARTERY BYPASS GRAFTING (CABG) (N/A) TRANSESOPHAGEAL ECHOCARDIOGRAM (TEE) (N/A) Subjective:  Mr. Skoda is doing well.  He complains of some mild soreness this morning.  He is ambulating, +BM  Objective: Vital signs in last 24 hours: Temp:  [98 F (36.7 C)-98.6 F (37 C)] 98 F (36.7 C) (10/31 0453) Pulse Rate:  [89-93] 93  (10/31 0453) Cardiac Rhythm:  [-] Normal sinus rhythm (10/31 0900) Resp:  [18-19] 19  (10/31 0453) BP: (97-117)/(68-86) 97/72 mmHg (10/31 0453) SpO2:  [90 %-98 %] 97 % (10/31 0453) Weight:  [197 lb 12 oz (89.7 kg)] 197 lb 12 oz (89.7 kg) (10/31 0654)  Intake/Output from previous day: 10/30 0701 - 10/31 0700 In: 648.1 [P.O.:480; I.V.:68.1; IV Piggyback:100] Out: 1555 [Urine:1515; Chest Tube:40] Intake/Output this shift: Total I/O In: 240 [P.O.:240] Out: 700 [Urine:700]  General appearance: alert, cooperative and no distress Heart: regular rate and rhythm Lungs: clear to auscultation bilaterally Abdomen: soft, non-tender; bowel sounds normal; no masses,  no organomegaly Extremities: edema trace Wound: clean and dry  Lab Results:  Basename 09/21/12 0459 09/20/12 0400  WBC 10.6* 10.7*  HGB 9.2* 9.4*  HCT 27.8* 27.9*  PLT 133* 137*   BMET:  Basename 09/21/12 0459 09/20/12 0400  NA 133* 133*  K 4.3 3.6  CL 98 99  CO2 26 28  GLUCOSE 195* 79  BUN 20 18  CREATININE 0.85 0.79  CALCIUM 8.4 8.3*    PT/INR:  Basename 09/18/12 1418  LABPROT 16.4*  INR 1.35   ABG    Component Value Date/Time   PHART 7.347* 09/18/2012 1834   HCO3 23.8 09/18/2012 1834   TCO2 22 09/19/2012 1701   ACIDBASEDEF 2.0 09/18/2012 1834   O2SAT 93.0 09/18/2012 1834   CBG (last 3)   Basename 09/21/12 0602 09/20/12 2140 09/20/12 1707  GLUCAP 198* 202* 196*    Assessment/Plan: S/P Procedure(s) (LRB): CORONARY ARTERY BYPASS GRAFTING (CABG) (N/A) TRANSESOPHAGEAL ECHOCARDIOGRAM (TEE) (N/A)  1. CV- NSR, tachycardic,  hypotensive- on Lopressor 2. Pulm- no acute issues, continue IS 3. Volume Overload- weight slightly up since surgery will continue Lasix 4. DM- recently placed on insulin, CBGs creeping up around 200, will obtain Diabetes education consult 5. Dispo- patient doing well, will observe today if no arrythmia will d/c EPW in AM, possibly d/c home Saturday   LOS: 3 days    BARRETT, ERIN 09/21/2012   patient examined and medical record reviewed,agree with above note. VAN TRIGT III,Yailine Ballard 09/22/2012

## 2012-09-21 NOTE — Progress Notes (Signed)
CARDIAC REHAB PHASE I   PRE:  Rate/Rhythm: 98 SR    BP: sitting 110/60    SaO2: 94 RA  MODE:  Ambulation: 550 ft   POST:  Rate/Rhythm: 120 ST    BP: sitting 124/70     SaO2: 94 RA  Tolerated great. Feels very well. HR up, asymptomatic. Return to recliner.Sts he has a RW at home. Can walk independently here in hospital with room RW. [id number]  Harriet Masson CES, ACSM

## 2012-09-22 DIAGNOSIS — Z951 Presence of aortocoronary bypass graft: Secondary | ICD-10-CM

## 2012-09-22 LAB — GLUCOSE, CAPILLARY
Glucose-Capillary: 140 mg/dL — ABNORMAL HIGH (ref 70–99)
Glucose-Capillary: 154 mg/dL — ABNORMAL HIGH (ref 70–99)
Glucose-Capillary: 228 mg/dL — ABNORMAL HIGH (ref 70–99)

## 2012-09-22 MED ORDER — INSULIN GLARGINE 100 UNIT/ML ~~LOC~~ SOLN: 25.0000 [IU] | Freq: Two times a day (BID) | SUBCUTANEOUS | Status: DC

## 2012-09-22 MED ORDER — POLYETHYLENE GLYCOL 3350 17 G PO PACK
17.0000 g | PACK | Freq: Once | ORAL | Status: AC
  Administered 2012-09-22: 17 g via ORAL
  Filled 2012-09-22: qty 1

## 2012-09-22 MED ORDER — METOPROLOL TARTRATE 25 MG PO TABS
25.0000 mg | ORAL_TABLET | Freq: Two times a day (BID) | ORAL | Status: DC
  Administered 2012-09-22 – 2012-09-23 (×3): 25 mg via ORAL
  Filled 2012-09-22 (×4): qty 1

## 2012-09-22 MED ORDER — LISINOPRIL 5 MG PO TABS: 5.0000 mg | ORAL_TABLET | Freq: Every day | ORAL | Status: DC

## 2012-09-22 MED ORDER — POTASSIUM CHLORIDE CRYS ER 20 MEQ PO TBCR: 20.0000 meq | EXTENDED_RELEASE_TABLET | Freq: Every day | ORAL | Status: DC

## 2012-09-22 MED ORDER — FE FUMARATE-B12-VIT C-FA-IFC PO CAPS: 1.0000 | ORAL_CAPSULE | Freq: Three times a day (TID) | ORAL | Status: DC

## 2012-09-22 MED ORDER — ASPIRIN 325 MG PO TBEC: 325.0000 mg | DELAYED_RELEASE_TABLET | Freq: Every day | ORAL | Status: DC

## 2012-09-22 MED ORDER — FUROSEMIDE 40 MG PO TABS: 40.0000 mg | ORAL_TABLET | Freq: Every day | ORAL | Status: DC

## 2012-09-22 MED ORDER — METOPROLOL TARTRATE 25 MG PO TABS: 25.0000 mg | ORAL_TABLET | Freq: Two times a day (BID) | ORAL | Status: DC

## 2012-09-22 MED ORDER — TRAMADOL HCL 50 MG PO TABS: 50.0000 mg | ORAL_TABLET | Freq: Four times a day (QID) | ORAL | Status: DC | PRN

## 2012-09-22 NOTE — Progress Notes (Addendum)
4 Days Post-Op Procedure(s) (LRB): CORONARY ARTERY BYPASS GRAFTING (CABG) (N/A) TRANSESOPHAGEAL ECHOCARDIOGRAM (TEE) (N/A) Subjective:  Mr. Walgren has no complaints this morning.  He states he is doing very well.  He is ambulating without difficulty +BM  Objective: Vital signs in last 24 hours: Temp:  [98.2 F (36.8 C)-98.6 F (37 C)] 98.6 F (37 C) (11/01 0402) Pulse Rate:  [81-102] 92  (11/01 0402) Cardiac Rhythm:  [-] Sinus tachycardia (10/31 1936) Resp:  [18] 18  (11/01 0402) BP: (94-110)/(64-69) 110/69 mmHg (11/01 0402) SpO2:  [92 %-94 %] 94 % (11/01 0402) Weight:  [194 lb 7.1 oz (88.2 kg)] 194 lb 7.1 oz (88.2 kg) (11/01 0402)    Intake/Output from previous day: 10/31 0701 - 11/01 0700 In: 960 [P.O.:960] Out: 1650 [Urine:1650]  General appearance: alert, cooperative and no distress Heart: regular rate and rhythm Lungs: clear to auscultation bilaterally Abdomen: soft, non-tender; bowel sounds normal; no masses,  no organomegaly Extremities: edema trace Wound: clean and dry  Lab Results:  Basename 09/21/12 0459 09/20/12 0400  WBC 10.6* 10.7*  HGB 9.2* 9.4*  HCT 27.8* 27.9*  PLT 133* 137*   BMET:  Basename 09/21/12 0459 09/20/12 0400  NA 133* 133*  K 4.3 3.6  CL 98 99  CO2 26 28  GLUCOSE 195* 79  BUN 20 18  CREATININE 0.85 0.79  CALCIUM 8.4 8.3*    PT/INR: No results found for this basename: LABPROT,INR in the last 72 hours ABG    Component Value Date/Time   PHART 7.347* 09/18/2012 1834   HCO3 23.8 09/18/2012 1834   TCO2 22 09/19/2012 1701   ACIDBASEDEF 2.0 09/18/2012 1834   O2SAT 93.0 09/18/2012 1834   CBG (last 3)   Basename 09/22/12 0602 09/21/12 2141 09/21/12 1854  GLUCAP 140* 179* 160*    Assessment/Plan: S/P Procedure(s) (LRB): CORONARY ARTERY BYPASS GRAFTING (CABG) (N/A) TRANSESOPHAGEAL ECHOCARDIOGRAM (TEE) (N/A)  1. CV- NSR, Tachy will increase Lopressor to 25mg  BID 2. Pulm- no acute issues, continue IS 3. Volume Overload- continue  Lasix 4. DM- continue current insulin regimen, received education yesterday  5. DIspo- patient doing well, will d/c EPW today and plan for d/c in AM if no issues arise   LOS: 4 days    BARRETT, ERIN 09/22/2012   patient examined and medical record reviewed,agree with above note. VAN TRIGT III,Taten Merrow 09/22/2012

## 2012-09-22 NOTE — Progress Notes (Signed)
Pt ambulated with no assistance and on room air. Tolerated very well. Pt ambulated approx. 600 feet. Pt back to chair with call bell in reach.

## 2012-09-22 NOTE — Progress Notes (Signed)
dc'ed pacing wires per unit protacal pt  toarated well

## 2012-09-22 NOTE — Plan of Care (Signed)
Problem: Food- and Nutrition-Related Knowledge Deficit (NB-1.1) Goal: Nutrition education Formal process to instruct or train a patient/client in a skill or to impart knowledge to help patients/clients voluntarily manage or modify food choices and eating behavior to maintain or improve health.  Outcome: Completed/Met Date Met:  09/22/12  RD consulted for nutrition education regarding diabetes.     Lab Results  Component Value Date    HGBA1C 9.5* 09/15/2012    RD provided "Carbohydrate Counting for People with Diabetes" handout from the Academy of Nutrition and Dietetics. Patient stated he has "always had borderline diabetes" and has never spoken with a Museum/gallery exhibitions officer. Per patient diet recall, drinks mostly diet sodas. Encouraged carbohydrate portion control at meals and limitation of sweets. Handouts provided from Academy of [employer] and Dietetics. Patient verbalized understanding and motivated to follow diet guidelines/recommendations.  Expect good compliance.  Body mass index is 27.90 kg/(m^2). Pt meets criteria for Overweight based on current BMI.  Current diet order is Carbohydrate Modified Medium Calorie, patient is consuming approximately 75% of meals at this time. Labs and medications reviewed. No further nutrition interventions warranted at this time. If additional nutrition issues arise, please re-consult RD.  Kirkland Hun, RD, LDN Pager #: [phone] After-Hours Pager #: [phone]

## 2012-09-22 NOTE — Progress Notes (Signed)
CARDIAC REHAB PHASE I   PRE:  Rate/Rhythm: 95SR  BP:  Supine:   Sitting: 130/68  Standing:    SaO2: 94%RA  MODE:  Ambulation: 890 ft   POST:  Rate/Rhythem: 120ST  BP:  Supine:   Sitting: 120/70  Standing:    SaO2: 93-95%RA 0920-1008 Pt walked 890 ft on RA with rolling walker independently. Has walker at home if he needs to use it. Tolerated well. Education completed with pt. Discussed CRP 2 with pt. Permission given to send letter to [facility] to notify them to follow up. Gave pt diabetic and heart healthy diets. States someone had already called him about outpt diabetic classes.  Antonio Villarreal

## 2012-09-22 NOTE — Discharge Summary (Signed)
Physician Discharge Summary  Patient ID: Antonio Villarreal MRN: [id number] DOB/AGE: 1941-07-12 71 y.o.  Admit date: 09/18/2012 Discharge date: 09/22/2012  Admission Diagnoses:  Patient Active Problem List  Diagnosis  . Fatigue  . Hypertension  . Hyperlipidemia  . Diabetes mellitus  . Dyspnea on exertion  . BPH (benign prostatic hyperplasia)  . S/P CABG x 4   Discharge Diagnoses:   Patient Active Problem List  Diagnosis  . Fatigue  . Hypertension  . Hyperlipidemia  . Diabetes mellitus  . Dyspnea on exertion  . BPH (benign prostatic hyperplasia)  . S/P CABG x 4   Discharged Condition: good  History of Present Illness:   Antonio Villarreal is a very pleasant 71 yo white male referred to [facility] by Dr. Gwen Pounds. He has a known history of diabetes, hypertension, and previous nicotine abuse.   He presented with a complaint of shortness of breath and angina symptoms. He underwent stress testing which was positive.  He was also underwent Echocardiogram which showed mild aortic stenosis and a preserved LV function.  Therefore, he was set up for cardiac catheterization which demonstrated severe coronary artery disease.  Due to the severity of his disease he was sent to Dr. Donata Clay for possible coronary bypass procedure.  He was evaluated on 09/01/2012 at which time it was felt he would benefit from coronary bypass procedure.  The risks and benefits of the procedure were explained to the patient and he was agreeable to proceed with surgery, tentatively scheduled for 09/07/2012.  However, the patient presented for pre-assessment and his blood glucose was found to be 390.  He surgery was canceled and he was set up with a diabetic specialist who placed the patient on an insulin regimen.  The patients blood sugars had been well controlled since starting insulin.  He again was evaluated by Dr. Donata Clay on 09/13/2012 at which time he felt the patient was stable to proceed with surgery scheduled for 09/18/2012.    Hospital Course:   Antonio Villarreal presented to [facility] on 09/18/2012.  He was taken to the operating room and underwent CABG x 4 utilizing LIMA to LAD, SVG to Ramus Intermediate, SVG to Obtuse 1 and SVG to Distal Left Circumflex.  He also underwent Endoscopic saphenous vein harvest of his right and left thigh.  The patient tolerated the procedure well and was taken to the SICU in stable condition.  POD # 0 the patient was extubated.  POD #1 patient was doing well.  He was off all pressor support.  He was weaned off insulin drip and started on Lantus regimen.  However, his blood sugars became elevated and he was restarted on insulin drip.  POD #2 patients urinary output decreased overnight.  He was placed on Dopamine, which was later weaned off.  His chest tubes were removed without difficulty.  He was medically stable with good blood sugar control on Lantus and sliding scale coverage.  He was transferred to the step down unit.  POD #3 patients chest xray did not show evidence of pneumothorax.  His blood sugars were somewhat elevated and diabetes education consult was obtained.  His Lantus was also increased to twice daily dosing.  POD #4 patients blood sugar control improved.  He is maintaining NSR however is tachycardic and his Lopressor was increased.  His external pacing wires were removed without difficulty.  Should no further issues arise we will plan for discharge home in the next 24 hours.  His insulin regimen  at discharge will be Lantus 25 units twice daily with Lispro 8 units with meals.  He will need to follow up with diabetes education center as scheduled.  He will also need to follow up with Dr. Donata Clay on October 18, 2012 at 12:30 pm.  Finally he will need to set up a 2 week follow up appointment with Dr. Gwen Pounds  Consults: Diabetes Education  Treatments: surgery:   Coronary artery bypass grafting x4 (left internal mammary artery  left anterior descending, saphenous vein graft to  obtuse marginal  1, saphenous vein graft to distal circumflex posterolateral,  saphenous vein graft to ramus intermediate).  2. Endoscopic harvest of bilateral greater saphenous vein   The patient has been discharged on:   1.Beta Blocker:  Yes [  x ]                              No   [   ]                              If No, reason:  2.Ace Inhibitor/ARB: Yes [ x  ]                                     No  [    ]                                     If No, reason:  3.Statin:   Yes [  x ]                  No  [   ]                  If No, reason:  4.Ecasa:  Yes  [ x  ]                  No   [   ]                  If No, reason:     Disposition: Home      Discharge Orders    Future Appointments: Provider: Department: Dept Phone: Center:   10/12/2012 3:00 PM Young Berry May, RN, RD, CDE [facility] [phone] [facility]   10/18/2012 12:30 PM Kerin Perna, MD [facility] [phone] [facility]     Future Orders Please Complete By Expires   Ambulatory referral to Nutrition and Diabetic Education          Medication List     As of 09/22/2012 12:46 PM    STOP taking these medications         isosorbide mononitrate 30 MG 24 hr tablet   Commonly known as: IMDUR      lisinopril-hydrochlorothiazide 10-12.5 MG per tablet   Commonly known as: PRINZIDE,ZESTORETIC      metoprolol succinate 25 MG 24 hr tablet   Commonly known as: TOPROL-XL      TAKE these medications         acetaminophen 500 MG tablet   Commonly known as: TYLENOL   Take 1,000 mg by mouth every 6 (six) hours as needed.      aspirin 325 MG  EC tablet   Take 1 tablet (325 mg total) by mouth daily.      co-enzyme Q-10 30 MG capsule   Take 30 mg by mouth daily.      ferrous fumarate-b12-vitamic C-folic acid capsule   Commonly known as: TRINSICON / FOLTRIN   Take 1 capsule by mouth 3 (three) times daily after meals.      furosemide 40 MG tablet   Commonly known as: LASIX   Take 1 tablet (40  mg total) by mouth daily. For 5 Days      HUMALOG KWIKPEN 100 UNIT/ML injection   Generic drug: insulin lispro   8 Units 3 (three) times daily before meals.      insulin glargine 100 UNIT/ML injection   Commonly known as: LANTUS   Inject 25 Units into the skin 2 (two) times daily.      lisinopril 5 MG tablet   Commonly known as: PRINIVIL,ZESTRIL   Take 1 tablet (5 mg total) by mouth daily.      lovastatin 40 MG tablet   Commonly known as: MEVACOR   Take 40 mg by mouth at bedtime.      metoprolol tartrate 25 MG tablet   Commonly known as: LOPRESSOR   Take 1 tablet (25 mg total) by mouth 2 (two) times daily.      mupirocin ointment 2 %   Commonly known as: BACTROBAN   Place 1 application into both nostrils 2 (two) times daily.      naproxen 500 MG tablet   Commonly known as: NAPROSYN   Take 500 mg by mouth 2 (two) times daily between meals as needed. For pain      potassium chloride SA 20 MEQ tablet   Commonly known as: K-DUR,KLOR-CON   Take 1 tablet (20 mEq total) by mouth daily. For 5 Days      traMADol 50 MG tablet   Commonly known as: ULTRAM   Take 1 tablet (50 mg total) by mouth every 6 (six) hours as needed.        Follow-up Information    Follow up with VAN Dinah Beers, MD. On 10/18/2012. (Appointment is at 1230pm)    Contact information:   [address] [employer] [address] [place] [address] [phone]       Follow up with [facility]. On 10/18/2012. (Please get chest xray at 11:30pm)    Contact information:   [address] [place] [address]       Schedule an appointment as soon as possible for a visit with Lamar Blinks, MD. (Please contact office to set up appointment for 2 weeks time)    Contact information:   [address] [facility] [address] [place] [address] [phone]          Signed: Lowella Dandy 09/22/2012, 12:46 PM

## 2012-09-23 LAB — GLUCOSE, CAPILLARY
Glucose-Capillary: 83 mg/dL (ref 70–99)
Glucose-Capillary: 93 mg/dL (ref 70–99)

## 2012-09-23 NOTE — Progress Notes (Signed)
Discharge instructions given to pt and pt's family along with prescriptions. Pt verbalized understanding of education. Pt is stable for discharge. Will call volunteers to wheel him out with wife.

## 2012-09-23 NOTE — Progress Notes (Signed)
Removed pt's chest tube sutures and placed steri strips on site with benzoin. Pt tolerated well.

## 2012-09-23 NOTE — Progress Notes (Signed)
                    [address]            Antonio Villarreal [address]          [phone]     5 Days Post-Op Procedure(s) (LRB): CORONARY ARTERY BYPASS GRAFTING (CABG) (N/A) TRANSESOPHAGEAL ECHOCARDIOGRAM (TEE) (N/A)  Subjective: Feels well, wants to go home.   Objective: Vital signs in last 24 hours: Patient Vitals for the past 24 hrs:  BP Temp Temp src Pulse Resp SpO2 Weight  09/23/12 0521 111/65 mmHg 97.9 F (36.6 C) Oral 82  19  95 % 194 lb 0.1 oz (88 kg)  09/22/12 2122 109/79 mmHg 97.8 F (36.6 C) Oral 98  18  95 % -  09/22/12 1500 131/78 mmHg 98.5 F (36.9 C) Oral 95  16  95 % -  09/22/12 1330 121/80 mmHg - - 88  - - -  09/22/12 1230 110/71 mmHg - - 90  - - -  09/22/12 1200 101/62 mmHg - - 91  - - -  09/22/12 1130 98/72 mmHg - - 89  - - -  09/22/12 1115 110/60 mmHg - - 88  - - -  09/22/12 1100 110/70 mmHg - - 68  - - -  09/22/12 1045 100/40 mmHg - - 89  - - -   Current Weight  09/23/12 194 lb 0.1 oz (88 kg)     Intake/Output from previous day:    CBGs [id number]  PHYSICAL EXAM:  Heart: RRR Lungs: Clear Wound: Clean and dry Extremities: Mild LE edema, R>L    Lab Results: CBC: Basename 09/21/12 0459  WBC 10.6*  HGB 9.2*  HCT 27.8*  PLT 133*   BMET:  Basename 09/21/12 0459  NA 133*  K 4.3  CL 98  CO2 26  GLUCOSE 195*  BUN 20  CREATININE 0.85  CALCIUM 8.4    PT/INR: No results found for this basename: LABPROT,INR in the last 72 hours    Assessment/Plan: S/P Procedure(s) (LRB): CORONARY ARTERY BYPASS GRAFTING (CABG) (N/A) TRANSESOPHAGEAL ECHOCARDIOGRAM (TEE) (N/A) Doing well.  Sugars generally stable. Plan d/c home today-instructions reviewed with pt.   LOS: 5 days    Antonio Villarreal H 09/23/2012

## 2012-09-25 LAB — GLUCOSE, CAPILLARY: Glucose-Capillary: 168 mg/dL — ABNORMAL HIGH (ref 70–99)

## 2012-09-27 MED FILL — Heparin Sodium (Porcine) Inj 1000 Unit/ML: INTRAMUSCULAR | Qty: 10 | Status: AC

## 2012-09-27 MED FILL — Sodium Chloride IV Soln 0.9%: INTRAVENOUS | Qty: 1000 | Status: AC

## 2012-09-27 MED FILL — Sodium Chloride Irrigation Soln 0.9%: Qty: 3000 | Status: AC

## 2012-09-27 MED FILL — Lidocaine HCl IV Inj 20 MG/ML: INTRAVENOUS | Qty: 5 | Status: AC

## 2012-09-27 MED FILL — Sodium Bicarbonate IV Soln 8.4%: INTRAVENOUS | Qty: 50 | Status: AC

## 2012-09-27 MED FILL — Mannitol IV Soln 20%: INTRAVENOUS | Qty: 500 | Status: AC

## 2012-09-27 MED FILL — Heparin Sodium (Porcine) Inj 1000 Unit/ML: INTRAMUSCULAR | Qty: 30 | Status: AC

## 2012-09-27 MED FILL — Electrolyte-R (PH 7.4) Solution: INTRAVENOUS | Qty: 6000 | Status: AC

## 2012-10-09 NOTE — Discharge Summary (Signed)
patient examined and medical record reviewed,agree with above note. VAN TRIGT III,PETER 10/09/2012    

## 2012-10-12 ENCOUNTER — Ambulatory Visit: Payer: Medicare Other | Admitting: Dietician

## 2012-10-16 ENCOUNTER — Encounter: Payer: Self-pay | Admitting: Internal Medicine

## 2012-10-17 ENCOUNTER — Other Ambulatory Visit: Payer: Self-pay | Admitting: Cardiothoracic Surgery

## 2012-10-17 DIAGNOSIS — Z951 Presence of aortocoronary bypass graft: Secondary | ICD-10-CM

## 2012-10-18 ENCOUNTER — Encounter: Payer: Self-pay | Admitting: Cardiothoracic Surgery

## 2012-10-18 ENCOUNTER — Ambulatory Visit
Admission: RE | Admit: 2012-10-18 | Discharge: 2012-10-18 | Disposition: A | Discharge status: Home / Self Care | Payer: Medicare Other | Source: Ambulatory Visit | Attending: Cardiothoracic Surgery | Admitting: Cardiothoracic Surgery

## 2012-10-18 ENCOUNTER — Ambulatory Visit (INDEPENDENT_AMBULATORY_CARE_PROVIDER_SITE_OTHER): Payer: Self-pay | Admitting: Cardiothoracic Surgery

## 2012-10-18 VITALS — BP 99/62 | HR 80 | Resp 16 | Ht 71.0 in | Wt 188.0 lb

## 2012-10-18 DIAGNOSIS — Z951 Presence of aortocoronary bypass graft: Secondary | ICD-10-CM

## 2012-10-18 DIAGNOSIS — I2510 Atherosclerotic heart disease of native coronary artery without angina pectoris: Secondary | ICD-10-CM
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
## 2012-10-23 ENCOUNTER — Encounter: Payer: Self-pay | Admitting: Internal Medicine

## 2012-11-22 ENCOUNTER — Encounter: Payer: Self-pay | Admitting: Internal Medicine

## 2012-11-29 ENCOUNTER — Ambulatory Visit (INDEPENDENT_AMBULATORY_CARE_PROVIDER_SITE_OTHER): Payer: Self-pay | Admitting: Cardiothoracic Surgery

## 2012-11-29 ENCOUNTER — Encounter: Payer: Self-pay | Admitting: Cardiothoracic Surgery

## 2012-11-29 VITALS — BP 101/61 | HR 73 | Resp 16 | Ht 71.0 in | Wt 192.0 lb

## 2012-11-29 DIAGNOSIS — I2510 Atherosclerotic heart disease of native coronary artery without angina pectoris: Secondary | ICD-10-CM

## 2012-11-29 DIAGNOSIS — Z951 Presence of aortocoronary bypass graft: Secondary | ICD-10-CM
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
## 2012-12-23 ENCOUNTER — Encounter: Payer: Self-pay | Admitting: Internal Medicine

## 2012-12-26 ENCOUNTER — Ambulatory Visit: Payer: Self-pay | Admitting: Internal Medicine

## 2013-04-30 ENCOUNTER — Ambulatory Visit: Payer: Self-pay | Admitting: Vascular Surgery

## 2013-04-30 LAB — BASIC METABOLIC PANEL
Anion Gap: 7 (ref 7–16)
Creatinine: 1.08 mg/dL (ref 0.60–1.30)
EGFR (Non-African Amer.): >60

## 2013-05-17 ENCOUNTER — Ambulatory Visit: Payer: Self-pay | Admitting: Vascular Surgery

## 2013-05-17 LAB — BASIC METABOLIC PANEL
Calcium, Total: 9.3 mg/dL (ref 8.5–10.1)
Chloride: 107 mmol/L (ref 98–107)
Co2: 25 mmol/L (ref 21–32)
EGFR (Non-African Amer.): >60
Osmolality: 288 (ref 275–301)
Sodium: 141 mmol/L (ref 136–145)

## 2013-09-27 ENCOUNTER — Other Ambulatory Visit: Payer: Self-pay

## 2013-10-04 ENCOUNTER — Ambulatory Visit: Payer: Self-pay | Admitting: Orthopedic Surgery

## 2013-11-06 ENCOUNTER — Ambulatory Visit: Payer: Self-pay | Admitting: Pain Medicine

## 2013-11-29 ENCOUNTER — Ambulatory Visit: Payer: Self-pay | Admitting: Pain Medicine

## 2013-12-12 ENCOUNTER — Ambulatory Visit: Payer: Self-pay | Admitting: Pain Medicine

## 2014-02-28 ENCOUNTER — Ambulatory Visit: Payer: Self-pay | Admitting: Pain Medicine

## 2014-03-18 ENCOUNTER — Ambulatory Visit: Payer: Self-pay | Admitting: Pain Medicine

## 2014-06-17 ENCOUNTER — Ambulatory Visit: Payer: Self-pay | Admitting: Pain Medicine

## 2014-10-16 DIAGNOSIS — M19071 Primary osteoarthritis, right ankle and foot: Secondary | ICD-10-CM | POA: Insufficient documentation

## 2014-12-31 DIAGNOSIS — I340 Nonrheumatic mitral (valve) insufficiency: Secondary | ICD-10-CM | POA: Insufficient documentation

## 2015-01-29 DIAGNOSIS — Z96661 Presence of right artificial ankle joint: Secondary | ICD-10-CM | POA: Insufficient documentation

## 2015-01-29 DIAGNOSIS — Z471 Aftercare following joint replacement surgery: Secondary | ICD-10-CM | POA: Insufficient documentation

## 2015-03-14 NOTE — Op Note (Signed)
PATIENT NAME:  Antonio Villarreal, Antonio Villarreal MR#:  [id number] DATE OF BIRTH:  21-Mar-1941  DATE OF PROCEDURE:  05/17/2013  PREOPERATIVE DIAGNOSES:   1.  Peripheral arterial disease with claudication, bilateral lower extremity, status post right lower extremity revascularization with improvement.  2.  Hypertension.  3.  Diabetes.  4.  Coronary artery disease.   POSTOPERATIVE DIAGNOSES:   1.  Peripheral arterial disease with claudication, bilateral lower extremity, status post right lower extremity revascularization with improvement.  2.  Hypertension.  3.  Diabetes.  4.  Coronary artery disease.   PROCEDURES:   1.  Ultrasound guidance for vascular access, right femoral artery.  2.  Catheter placement to left anterior tibial artery from right femoral approach.  3.  Aortogram and selective left lower extremity angiogram.  4.  Percutaneous transluminal angioplasty of left popliteal artery with 5 mm diameter angioplasty balloon.  5.  Percutaneous transluminal angioplasty of long segment of left anterior tibial artery with 3 mm diameter angioplasty balloon.  6.  StarClose closure device, right femoral artery.   SURGEON:  Algernon Huxley, MD   ANESTHESIA:  Local with moderate conscious sedation.   ESTIMATED BLOOD LOSS:  Minimal.   INDICATION FOR PROCEDURE:  This is a 74 year old white male with peripheral vascular disease. He has undergone right lower extremity revascularization with significant improvement in his claudication. He returns for revascularization of his left lower extremity if appropriate with angiography today. Risks and benefits were discussed. Informed consent was obtained.   DESCRIPTION OF PROCEDURE:  The patient was brought to the vascular interventional radiology suite. The groins were shaved and prepped and a sterile surgical field was created. The right femoral head was localized with fluoroscopy. Ultrasound was used to access a patent right femoral artery without difficulty with a  Seldinger needle. A J-wire and 5 French sheath were placed. Pigtail catheter was placed into the aorta at the L1 level and an AP aortogram was performed. This showed no flow-limiting stenosis in the aortoiliac segments. I then hooked the bifurcation and advanced to the left femoral head and selective left lower extremity angiogram was then performed. Imaging showed a patent common femoral artery and profunda femoris artery. The superficial femoral artery was calcified and had some areas of mild stenosis, but none that were flow limiting. The popliteal artery had an approximately 60 to 65% stenosis just below the knee. The anterior tibial artery was the best runoff distally, although the posterior tibial artery was patent as well. It provided some runoff to the foot, although it was smaller and more diseased. The anterior tibial artery had several segments with 70% or greater stenosis in the proximal and mid segment. At this point, the patient was systemically heparinized. A 6 French Ansell sheath was placed over a Terumo advantage wire and exchanged for an 0.018 wire and a 135 angled catheter was able to cross the popliteal stenosis without difficulty. I then got to the anterior tibial artery. This was more tedious in terms of crossing the areas of stenosis particularly the one in the mid segment, but ultimately with the 135 angled catheter and a V18 wire, I was able to cross the stenosis and park the wire into the foot. A 3 mm diameter angioplasty balloon was inflated in the anterior tibial artery with good angiographic completion result. A 5 mm diameter angioplasty balloon was inflated in the popliteal artery with good angiographic completion result. No air stenoses greater than 30% were seen in any of the lesions treated. At  this point, I terminated the procedure.   The sheath was pulled back to the ipsilateral external iliac artery and oblique arteriogram was performed. StarClose closure device was deployed in  the usual fashion with excellent hemostatic result. The patient tolerated the procedure well and was taken to the recovery room in stable condition.    ____________________________ Algernon Huxley, MD jsd:si D: 05/17/2013 14:48:55 ET T: 05/17/2013 15:59:58 ET JOB#: [id number]  cc: Algernon Huxley, MD, <Dictator> Algernon Huxley MD ELECTRONICALLY SIGNED 05/28/2013 13:18

## 2015-03-14 NOTE — Op Note (Signed)
PATIENT NAME:  Antonio Villarreal, Antonio Villarreal MR#:  [id number] DATE OF BIRTH:  [birth year]  DATE OF PROCEDURE:  04/30/2013  PREOPERATIVE DIAGNOSES:  1.  Peripheral arterial disease with claudication, bilateral lower extremities.  2.  Diabetes mellitus.  3.  Hypertension.  4.  Coronary disease.   POSTOPERATIVE DAIGNOSES: 1.  Peripheral arterial disease with claudication, bilateral lower extremities.  2.  Diabetes mellitus.  3.  Hypertension.  4.  Coronary disease.   PROCEDURE:  1.  Catheter placement into right anterior tibial artery and right posterior tibial artery from left femoral approach.  2.  Aortogram and selective right lower extremity angiogram.  3.  Percutaneous transluminal angioplasty of near entire right posterior tibial artery with a 3 mm diameter angioplasty balloon.  4.  Percutaneous transluminal angioplasty of right anterior tibial artery with 3 mm diameter angioplasty balloon.  5.  StarClose closure device to the left femoral artery.   SURGEON: Algernon Huxley, M.D.   ANESTHESIA: Local with moderate conscious sedation.   ESTIMATED BLOOD LOSS: Approximately 25 mL.  FLUOROSCOPY TIME: 7 minutes.  CONTRAST USED: 55 mL.   INDICATION FOR PROCEDURE: A 74 year old white male, who complains of bilateral disabling claudication symptoms, i.e. largely has disease in his tibial vessels, although he has significant calcification proximally. Angiogram is performed for further evaluation of his disease and potential treatment. Risks and benefits were discussed. Informed consent was obtained.   DESCRIPTION OF PROCEDURE: The patient was brought to the vascular and interventional radiology suite. Groins were shaved and prepped and a sterile surgical field was created. The left femoral head was localized with fluoroscopy. The left femoral artery was accessed without difficulty with Seldinger needle. A J-wire and 5-French sheath was placed. Pigtail catheter was placed in the aorta and the L1 level.  Angiogram was performed. This showed no flow-limiting stenosis within the aortoiliac segments with some diffuse calcification. I then hooked the aortic bifurcation and advanced to the right femoral head and selective right lower extremity angiogram was then performed. This demonstrated the common femoral artery, superficial femoral artery and profunda femoris artery to be patent without flow-limiting stenosis. Superficial femoral artery had several areas of mild disease of less than 30%. The popliteal artery was patent. He then had a normal tibial trifurcation. The anterior tibial artery initially was probably the best runoff distally, but had had an area of moderate to high-grade stenosis likely in the 80% range about 15 cm beyond its origin as well as an approximately 50% to 60% stenosis just a few centimeters beyond its origin. The peroneal artery was small and diffusely diseased. The anterior tibial artery occluded at the ankle. It was well collateralized to the foot. The posterior tibial artery had diffuse disease, but was a more sizable vessel and did have continuous flow into the foot despite multiple areas of high-grade stenosis, some of which were near occlusive. The patient was systemically heparinized. A 6-French Ansell sheath was placed over a Terumo advantage wire and I was able to get V18 wire along with a Kumpe catheter. I initially got through the posterior tibial artery and crossed all the lesions and parked a wire in the foot. A 3 mm diameter angioplasty balloon was inflated from just below the ankle up to the below-knee popliteal artery and tibioperoneal trunk area. The area had many wastes that were taken, which resolved with angioplasty.   Completion angiogram following this showed markedly improved flow with patent vessel into the foot. I then exchanged for use of a Kumpe  catheter and got the wire into the anterior tibial artery. I treated the lesions in the proximal mid anterior tibial  artery with a 3 mm diameter angioplasty balloon. A waste was taken, which resolved. This resulted in improved flow through the anterior tibial artery and no hemodynamically significant residual stenosis in the area was treated. At this point, I elected to terminate the procedure. The oblique arteriogram was performed of the left femoral artery. StarClose closure device was deployed in the usual fashion with excellent hemostatic result. The patient tolerated the procedure well and was taken to the recovery room in stable condition.   ____________________________ Algernon Huxley, MD jsd:aw D: 04/30/2013 10:27:23 ET T: 04/30/2013 11:48:45 ET JOB#: [id number]  cc: Algernon Huxley, MD, <Dictator> Algernon Huxley MD ELECTRONICALLY SIGNED 05/02/2013 10:59

## 2015-05-02 DIAGNOSIS — I10 Essential (primary) hypertension: Secondary | ICD-10-CM | POA: Insufficient documentation

## 2015-09-10 DIAGNOSIS — E782 Mixed hyperlipidemia: Secondary | ICD-10-CM | POA: Insufficient documentation

## 2015-12-08 DIAGNOSIS — E113293 Type 2 diabetes mellitus with mild nonproliferative diabetic retinopathy without macular edema, bilateral: Secondary | ICD-10-CM | POA: Diagnosis not present

## 2015-12-15 DIAGNOSIS — M9902 Segmental and somatic dysfunction of thoracic region: Secondary | ICD-10-CM | POA: Diagnosis not present

## 2015-12-15 DIAGNOSIS — M5416 Radiculopathy, lumbar region: Secondary | ICD-10-CM | POA: Diagnosis not present

## 2015-12-15 DIAGNOSIS — M9903 Segmental and somatic dysfunction of lumbar region: Secondary | ICD-10-CM | POA: Diagnosis not present

## 2015-12-15 DIAGNOSIS — M5134 Other intervertebral disc degeneration, thoracic region: Secondary | ICD-10-CM | POA: Diagnosis not present

## 2015-12-23 DIAGNOSIS — H401122 Primary open-angle glaucoma, left eye, moderate stage: Secondary | ICD-10-CM | POA: Diagnosis not present

## 2016-01-13 DIAGNOSIS — M9903 Segmental and somatic dysfunction of lumbar region: Secondary | ICD-10-CM | POA: Diagnosis not present

## 2016-01-13 DIAGNOSIS — M5416 Radiculopathy, lumbar region: Secondary | ICD-10-CM | POA: Diagnosis not present

## 2016-01-13 DIAGNOSIS — M5134 Other intervertebral disc degeneration, thoracic region: Secondary | ICD-10-CM | POA: Diagnosis not present

## 2016-01-13 DIAGNOSIS — M9902 Segmental and somatic dysfunction of thoracic region: Secondary | ICD-10-CM | POA: Diagnosis not present

## 2016-01-30 DIAGNOSIS — I10 Essential (primary) hypertension: Secondary | ICD-10-CM | POA: Diagnosis not present

## 2016-01-30 DIAGNOSIS — E782 Mixed hyperlipidemia: Secondary | ICD-10-CM | POA: Diagnosis not present

## 2016-01-30 DIAGNOSIS — E119 Type 2 diabetes mellitus without complications: Secondary | ICD-10-CM | POA: Diagnosis not present

## 2016-01-30 DIAGNOSIS — Z794 Long term (current) use of insulin: Secondary | ICD-10-CM | POA: Diagnosis not present

## 2016-02-09 DIAGNOSIS — M9902 Segmental and somatic dysfunction of thoracic region: Secondary | ICD-10-CM | POA: Diagnosis not present

## 2016-02-09 DIAGNOSIS — M5416 Radiculopathy, lumbar region: Secondary | ICD-10-CM | POA: Diagnosis not present

## 2016-02-09 DIAGNOSIS — M9903 Segmental and somatic dysfunction of lumbar region: Secondary | ICD-10-CM | POA: Diagnosis not present

## 2016-02-09 DIAGNOSIS — M5134 Other intervertebral disc degeneration, thoracic region: Secondary | ICD-10-CM | POA: Diagnosis not present

## 2016-02-11 DIAGNOSIS — E782 Mixed hyperlipidemia: Secondary | ICD-10-CM | POA: Diagnosis not present

## 2016-02-11 DIAGNOSIS — I10 Essential (primary) hypertension: Secondary | ICD-10-CM | POA: Diagnosis not present

## 2016-02-11 DIAGNOSIS — I25810 Atherosclerosis of coronary artery bypass graft(s) without angina pectoris: Secondary | ICD-10-CM | POA: Diagnosis not present

## 2016-02-11 DIAGNOSIS — E1159 Type 2 diabetes mellitus with other circulatory complications: Secondary | ICD-10-CM | POA: Diagnosis not present

## 2016-03-08 DIAGNOSIS — M5416 Radiculopathy, lumbar region: Secondary | ICD-10-CM | POA: Diagnosis not present

## 2016-03-08 DIAGNOSIS — M5134 Other intervertebral disc degeneration, thoracic region: Secondary | ICD-10-CM | POA: Diagnosis not present

## 2016-03-08 DIAGNOSIS — M9903 Segmental and somatic dysfunction of lumbar region: Secondary | ICD-10-CM | POA: Diagnosis not present

## 2016-03-08 DIAGNOSIS — M9902 Segmental and somatic dysfunction of thoracic region: Secondary | ICD-10-CM | POA: Diagnosis not present

## 2016-04-05 DIAGNOSIS — M9902 Segmental and somatic dysfunction of thoracic region: Secondary | ICD-10-CM | POA: Diagnosis not present

## 2016-04-05 DIAGNOSIS — M5134 Other intervertebral disc degeneration, thoracic region: Secondary | ICD-10-CM | POA: Diagnosis not present

## 2016-04-05 DIAGNOSIS — M19071 Primary osteoarthritis, right ankle and foot: Secondary | ICD-10-CM | POA: Diagnosis not present

## 2016-04-05 DIAGNOSIS — M85871 Other specified disorders of bone density and structure, right ankle and foot: Secondary | ICD-10-CM | POA: Diagnosis not present

## 2016-04-05 DIAGNOSIS — M5416 Radiculopathy, lumbar region: Secondary | ICD-10-CM | POA: Diagnosis not present

## 2016-04-05 DIAGNOSIS — M9903 Segmental and somatic dysfunction of lumbar region: Secondary | ICD-10-CM | POA: Diagnosis not present

## 2016-05-03 DIAGNOSIS — M5416 Radiculopathy, lumbar region: Secondary | ICD-10-CM | POA: Diagnosis not present

## 2016-05-03 DIAGNOSIS — M9902 Segmental and somatic dysfunction of thoracic region: Secondary | ICD-10-CM | POA: Diagnosis not present

## 2016-05-03 DIAGNOSIS — M5134 Other intervertebral disc degeneration, thoracic region: Secondary | ICD-10-CM | POA: Diagnosis not present

## 2016-05-03 DIAGNOSIS — M9903 Segmental and somatic dysfunction of lumbar region: Secondary | ICD-10-CM | POA: Diagnosis not present

## 2016-05-10 DIAGNOSIS — Z794 Long term (current) use of insulin: Secondary | ICD-10-CM | POA: Diagnosis not present

## 2016-05-10 DIAGNOSIS — I10 Essential (primary) hypertension: Secondary | ICD-10-CM | POA: Diagnosis not present

## 2016-05-10 DIAGNOSIS — E782 Mixed hyperlipidemia: Secondary | ICD-10-CM | POA: Diagnosis not present

## 2016-05-10 DIAGNOSIS — E119 Type 2 diabetes mellitus without complications: Secondary | ICD-10-CM | POA: Diagnosis not present

## 2016-05-31 DIAGNOSIS — M9902 Segmental and somatic dysfunction of thoracic region: Secondary | ICD-10-CM | POA: Diagnosis not present

## 2016-05-31 DIAGNOSIS — M5416 Radiculopathy, lumbar region: Secondary | ICD-10-CM | POA: Diagnosis not present

## 2016-05-31 DIAGNOSIS — M5134 Other intervertebral disc degeneration, thoracic region: Secondary | ICD-10-CM | POA: Diagnosis not present

## 2016-05-31 DIAGNOSIS — M9903 Segmental and somatic dysfunction of lumbar region: Secondary | ICD-10-CM | POA: Diagnosis not present

## 2016-06-22 DIAGNOSIS — H401122 Primary open-angle glaucoma, left eye, moderate stage: Secondary | ICD-10-CM | POA: Diagnosis not present

## 2016-06-28 DIAGNOSIS — M5416 Radiculopathy, lumbar region: Secondary | ICD-10-CM | POA: Diagnosis not present

## 2016-06-28 DIAGNOSIS — M9902 Segmental and somatic dysfunction of thoracic region: Secondary | ICD-10-CM | POA: Diagnosis not present

## 2016-06-28 DIAGNOSIS — M5134 Other intervertebral disc degeneration, thoracic region: Secondary | ICD-10-CM | POA: Diagnosis not present

## 2016-06-28 DIAGNOSIS — M9903 Segmental and somatic dysfunction of lumbar region: Secondary | ICD-10-CM | POA: Diagnosis not present

## 2016-06-29 DIAGNOSIS — H401122 Primary open-angle glaucoma, left eye, moderate stage: Secondary | ICD-10-CM | POA: Diagnosis not present

## 2016-07-28 DIAGNOSIS — M5134 Other intervertebral disc degeneration, thoracic region: Secondary | ICD-10-CM | POA: Diagnosis not present

## 2016-07-28 DIAGNOSIS — M9903 Segmental and somatic dysfunction of lumbar region: Secondary | ICD-10-CM | POA: Diagnosis not present

## 2016-07-28 DIAGNOSIS — M5416 Radiculopathy, lumbar region: Secondary | ICD-10-CM | POA: Diagnosis not present

## 2016-07-28 DIAGNOSIS — M9902 Segmental and somatic dysfunction of thoracic region: Secondary | ICD-10-CM | POA: Diagnosis not present

## 2016-08-03 DIAGNOSIS — I340 Nonrheumatic mitral (valve) insufficiency: Secondary | ICD-10-CM | POA: Diagnosis not present

## 2016-08-03 DIAGNOSIS — I10 Essential (primary) hypertension: Secondary | ICD-10-CM | POA: Diagnosis not present

## 2016-08-03 DIAGNOSIS — I25810 Atherosclerosis of coronary artery bypass graft(s) without angina pectoris: Secondary | ICD-10-CM | POA: Diagnosis not present

## 2016-08-03 DIAGNOSIS — I350 Nonrheumatic aortic (valve) stenosis: Secondary | ICD-10-CM | POA: Diagnosis not present

## 2016-08-03 DIAGNOSIS — E1159 Type 2 diabetes mellitus with other circulatory complications: Secondary | ICD-10-CM | POA: Diagnosis not present

## 2016-08-03 DIAGNOSIS — E782 Mixed hyperlipidemia: Secondary | ICD-10-CM | POA: Diagnosis not present

## 2016-08-11 DIAGNOSIS — E782 Mixed hyperlipidemia: Secondary | ICD-10-CM | POA: Diagnosis not present

## 2016-08-11 DIAGNOSIS — I10 Essential (primary) hypertension: Secondary | ICD-10-CM | POA: Diagnosis not present

## 2016-08-11 DIAGNOSIS — Z794 Long term (current) use of insulin: Secondary | ICD-10-CM | POA: Diagnosis not present

## 2016-08-11 DIAGNOSIS — E119 Type 2 diabetes mellitus without complications: Secondary | ICD-10-CM | POA: Diagnosis not present

## 2016-08-25 DIAGNOSIS — M5416 Radiculopathy, lumbar region: Secondary | ICD-10-CM | POA: Diagnosis not present

## 2016-08-25 DIAGNOSIS — M5134 Other intervertebral disc degeneration, thoracic region: Secondary | ICD-10-CM | POA: Diagnosis not present

## 2016-08-25 DIAGNOSIS — M9903 Segmental and somatic dysfunction of lumbar region: Secondary | ICD-10-CM | POA: Diagnosis not present

## 2016-08-25 DIAGNOSIS — M9902 Segmental and somatic dysfunction of thoracic region: Secondary | ICD-10-CM | POA: Diagnosis not present

## 2016-09-22 DIAGNOSIS — M5134 Other intervertebral disc degeneration, thoracic region: Secondary | ICD-10-CM | POA: Diagnosis not present

## 2016-09-22 DIAGNOSIS — M9903 Segmental and somatic dysfunction of lumbar region: Secondary | ICD-10-CM | POA: Diagnosis not present

## 2016-09-22 DIAGNOSIS — M9902 Segmental and somatic dysfunction of thoracic region: Secondary | ICD-10-CM | POA: Diagnosis not present

## 2016-09-22 DIAGNOSIS — M5416 Radiculopathy, lumbar region: Secondary | ICD-10-CM | POA: Diagnosis not present

## 2016-10-20 DIAGNOSIS — M5134 Other intervertebral disc degeneration, thoracic region: Secondary | ICD-10-CM | POA: Diagnosis not present

## 2016-10-20 DIAGNOSIS — M9902 Segmental and somatic dysfunction of thoracic region: Secondary | ICD-10-CM | POA: Diagnosis not present

## 2016-10-20 DIAGNOSIS — M5416 Radiculopathy, lumbar region: Secondary | ICD-10-CM | POA: Diagnosis not present

## 2016-10-20 DIAGNOSIS — M9903 Segmental and somatic dysfunction of lumbar region: Secondary | ICD-10-CM | POA: Diagnosis not present

## 2016-10-20 DIAGNOSIS — Z23 Encounter for immunization: Secondary | ICD-10-CM | POA: Diagnosis not present

## 2016-11-10 DIAGNOSIS — E782 Mixed hyperlipidemia: Secondary | ICD-10-CM | POA: Diagnosis not present

## 2016-11-10 DIAGNOSIS — I10 Essential (primary) hypertension: Secondary | ICD-10-CM | POA: Diagnosis not present

## 2016-11-10 DIAGNOSIS — E119 Type 2 diabetes mellitus without complications: Secondary | ICD-10-CM | POA: Diagnosis not present

## 2016-11-10 DIAGNOSIS — Z794 Long term (current) use of insulin: Secondary | ICD-10-CM | POA: Diagnosis not present

## 2016-11-17 DIAGNOSIS — M5416 Radiculopathy, lumbar region: Secondary | ICD-10-CM | POA: Diagnosis not present

## 2016-11-17 DIAGNOSIS — M9902 Segmental and somatic dysfunction of thoracic region: Secondary | ICD-10-CM | POA: Diagnosis not present

## 2016-11-17 DIAGNOSIS — M5134 Other intervertebral disc degeneration, thoracic region: Secondary | ICD-10-CM | POA: Diagnosis not present

## 2016-11-17 DIAGNOSIS — M9903 Segmental and somatic dysfunction of lumbar region: Secondary | ICD-10-CM | POA: Diagnosis not present

## 2016-12-15 DIAGNOSIS — M9903 Segmental and somatic dysfunction of lumbar region: Secondary | ICD-10-CM | POA: Diagnosis not present

## 2016-12-15 DIAGNOSIS — M9902 Segmental and somatic dysfunction of thoracic region: Secondary | ICD-10-CM | POA: Diagnosis not present

## 2016-12-15 DIAGNOSIS — M5134 Other intervertebral disc degeneration, thoracic region: Secondary | ICD-10-CM | POA: Diagnosis not present

## 2016-12-15 DIAGNOSIS — M5416 Radiculopathy, lumbar region: Secondary | ICD-10-CM | POA: Diagnosis not present

## 2016-12-28 DIAGNOSIS — H401122 Primary open-angle glaucoma, left eye, moderate stage: Secondary | ICD-10-CM | POA: Diagnosis not present

## 2017-01-07 DIAGNOSIS — H2513 Age-related nuclear cataract, bilateral: Secondary | ICD-10-CM | POA: Diagnosis not present

## 2017-01-11 ENCOUNTER — Encounter: Payer: Self-pay | Admitting: *Deleted

## 2017-01-12 DIAGNOSIS — M9902 Segmental and somatic dysfunction of thoracic region: Secondary | ICD-10-CM | POA: Diagnosis not present

## 2017-01-12 DIAGNOSIS — M5416 Radiculopathy, lumbar region: Secondary | ICD-10-CM | POA: Diagnosis not present

## 2017-01-12 DIAGNOSIS — M5134 Other intervertebral disc degeneration, thoracic region: Secondary | ICD-10-CM | POA: Diagnosis not present

## 2017-01-12 DIAGNOSIS — M9903 Segmental and somatic dysfunction of lumbar region: Secondary | ICD-10-CM | POA: Diagnosis not present

## 2017-01-12 NOTE — Discharge Instructions (Signed)
Cataract Surgery, Care After °Refer to this sheet in the next few weeks. These instructions provide you with information about caring for yourself after your procedure. Your health care provider may also give you more specific instructions. Your treatment has been planned according to current medical practices, but problems sometimes occur. Call your health care provider if you have any problems or questions after your procedure. °What can I expect after the procedure? °After the procedure, it is common to have: °· Itching. °· Discomfort. °· Fluid discharge. °· Sensitivity to light and to touch. °· Bruising. °Follow these instructions at home: °Eye Care  °· Check your eye every day for signs of infection. Watch for: °¨ Redness, swelling, or pain. °¨ Fluid, blood, or pus. °¨ Warmth. °¨ Bad smell. °Activity  °· Avoid strenuous activities, such as playing contact sports, for as long as told by your health care provider. °· Do not drive or operate heavy machinery until your health care provider approves. °· Do not bend or lift heavy objects . Bending increases pressure in the eye. You can walk, climb stairs, and do light household chores. °· Ask your health care provider when you can return to work. If you work in a dusty environment, you may be advised to wear protective eyewear for a period of time. °General instructions  °· Take or apply over-the-counter and prescription medicines only as told by your health care provider. This includes eye drops. °· Do not touch or rub your eyes. °· If you were given a protective shield, wear it as told by your health care provider. If you were not given a protective shield, wear sunglasses as told by your health care provider to protect your eyes. °· Keep the area around your eye clean and dry. Avoid swimming or allowing water to hit you directly in the face while showering until told by your health care provider. Keep soap and shampoo out of your eyes. °· Do not put a contact lens  into the affected eye or eyes until your health care provider approves. °· Keep all follow-up visits as told by your health care provider. This is important. °Contact a health care provider if: ° °· You have increased bruising around your eye. °· You have pain that is not helped with medicine. °· You have a fever. °· You have redness, swelling, or pain in your eye. °· You have fluid, blood, or pus coming from your incision. °· Your vision gets worse. °Get help right away if: °· You have sudden vision loss. °This information is not intended to replace advice given to you by your health care provider. Make sure you discuss any questions you have with your health care provider. °Document Released: 05/28/2005 Document Revised: 03/18/2016 Document Reviewed: 09/18/2015 °Elsevier Interactive Patient Education © 2017 [employer] ° ° ° ° °General Anesthesia, Adult, Care After °These instructions provide you with information about caring for yourself after your procedure. Your health care provider may also give you more specific instructions. Your treatment has been planned according to current medical practices, but problems sometimes occur. Call your health care provider if you have any problems or questions after your procedure. °What can I expect after the procedure? °After the procedure, it is common to have: °· Vomiting. °· A sore throat. °· Mental slowness. °It is common to feel: °· Nauseous. °· Cold or shivery. °· Sleepy. °· Tired. °· Sore or achy, even in parts of your body where you did not have surgery. °Follow these instructions at   home: °For at least 24 hours after the procedure:  °· Do not: °¨ Participate in activities where you could fall or become injured. °¨ Drive. °¨ Use heavy machinery. °¨ Drink alcohol. °¨ Take sleeping pills or medicines that cause drowsiness. °¨ Make important decisions or sign legal documents. °¨ Take care of children on your own. °· Rest. °Eating and drinking  °· If you vomit, drink  water, juice, or soup when you can drink without vomiting. °· Drink enough fluid to keep your urine clear or pale yellow. °· Make sure you have little or no nausea before eating solid foods. °· Follow the diet recommended by your health care provider. °General instructions  °· Have a responsible adult stay with you until you are awake and alert. °· Return to your normal activities as told by your health care provider. Ask your health care provider what activities are safe for you. °· Take over-the-counter and prescription medicines only as told by your health care provider. °· If you smoke, do not smoke without supervision. °· Keep all follow-up visits as told by your health care provider. This is important. °Contact a health care provider if: °· You continue to have nausea or vomiting at home, and medicines are not helpful. °· You cannot drink fluids or start eating again. °· You cannot urinate after 8-12 hours. °· You develop a skin rash. °· You have fever. °· You have increasing redness at the site of your procedure. °Get help right away if: °· You have difficulty breathing. °· You have chest pain. °· You have unexpected bleeding. °· You feel that you are having a life-threatening or urgent problem. °This information is not intended to replace advice given to you by your health care provider. Make sure you discuss any questions you have with your health care provider. °Document Released: 02/14/2001 Document Revised: 04/12/2016 Document Reviewed: 10/23/2015 °Elsevier Interactive Patient Education © 2017 [employer] Inc[employer] ° °

## 2017-01-17 ENCOUNTER — Encounter
Admission: RE | Disposition: A | Discharge status: Home / Self Care | Payer: Self-pay | Source: Ambulatory Visit | Attending: Ophthalmology

## 2017-01-17 ENCOUNTER — Ambulatory Visit: Payer: PPO | Admitting: Student in an Organized Health Care Education/Training Program

## 2017-01-17 ENCOUNTER — Ambulatory Visit
Admission: RE | Admit: 2017-01-17 | Discharge: 2017-01-17 | Disposition: A | Discharge status: Home / Self Care | Payer: PPO | Source: Ambulatory Visit | Attending: Ophthalmology | Admitting: Ophthalmology

## 2017-01-17 DIAGNOSIS — I2510 Atherosclerotic heart disease of native coronary artery without angina pectoris: Secondary | ICD-10-CM | POA: Diagnosis not present

## 2017-01-17 DIAGNOSIS — I10 Essential (primary) hypertension: Secondary | ICD-10-CM | POA: Insufficient documentation

## 2017-01-17 DIAGNOSIS — Z794 Long term (current) use of insulin: Secondary | ICD-10-CM | POA: Insufficient documentation

## 2017-01-17 DIAGNOSIS — H2512 Age-related nuclear cataract, left eye: Secondary | ICD-10-CM | POA: Diagnosis not present

## 2017-01-17 DIAGNOSIS — Z87891 Personal history of nicotine dependence: Secondary | ICD-10-CM | POA: Insufficient documentation

## 2017-01-17 DIAGNOSIS — E1136 Type 2 diabetes mellitus with diabetic cataract: Secondary | ICD-10-CM | POA: Diagnosis not present

## 2017-01-17 DIAGNOSIS — H2513 Age-related nuclear cataract, bilateral: Secondary | ICD-10-CM | POA: Diagnosis not present

## 2017-01-17 HISTORY — PX: CATARACT EXTRACTION W/PHACO: SHX586

## 2017-01-17 HISTORY — DX: Motion sickness, initial encounter: T75.3XXA

## 2017-01-17 HISTORY — DX: Presence of dental prosthetic device (complete) (partial): Z97.2

## 2017-01-17 LAB — GLUCOSE, CAPILLARY
GLUCOSE-CAPILLARY: 87 mg/dL (ref 65–99)
Glucose-Capillary: 85 mg/dL (ref 65–99)

## 2017-01-17 SURGERY — PHACOEMULSIFICATION, CATARACT, WITH IOL INSERTION
Anesthesia: Monitor Anesthesia Care | Site: Eye | Laterality: Left | Wound class: Clean

## 2017-01-17 MED ORDER — ARMC OPHTHALMIC DILATING DROPS
1.0000 "application " | OPHTHALMIC | Status: DC | PRN
  Administered 2017-01-17 (×3): 1 via OPHTHALMIC

## 2017-01-17 MED ORDER — FENTANYL CITRATE (PF) 100 MCG/2ML IJ SOLN
INTRAMUSCULAR | Status: DC | PRN
  Administered 2017-01-17: 50 ug via INTRAVENOUS

## 2017-01-17 MED ORDER — NA HYALUR & NA CHOND-NA HYALUR 0.4-0.35 ML IO KIT
PACK | INTRAOCULAR | Status: DC | PRN
  Administered 2017-01-17: 1 mL via INTRAOCULAR

## 2017-01-17 MED ORDER — MOXIFLOXACIN HCL 0.5 % OP SOLN
1.0000 [drp] | OPHTHALMIC | Status: DC | PRN
  Administered 2017-01-17 (×3): 1 [drp] via OPHTHALMIC

## 2017-01-17 MED ORDER — LACTATED RINGERS IV SOLN: INTRAVENOUS | Status: DC

## 2017-01-17 MED ORDER — BRIMONIDINE TARTRATE-TIMOLOL 0.2-0.5 % OP SOLN
OPHTHALMIC | Status: DC | PRN
  Administered 2017-01-17: 1 [drp] via OPHTHALMIC

## 2017-01-17 MED ORDER — EPINEPHRINE PF 1 MG/ML IJ SOLN
INTRAOCULAR | Status: DC | PRN
  Administered 2017-01-17: 127 mL via OPHTHALMIC

## 2017-01-17 MED ORDER — CEFUROXIME OPHTHALMIC INJECTION 1 MG/0.1 ML
INJECTION | OPHTHALMIC | Status: DC | PRN
  Administered 2017-01-17: .3 mL via OPHTHALMIC

## 2017-01-17 MED ORDER — MIDAZOLAM HCL 2 MG/2ML IJ SOLN
INTRAMUSCULAR | Status: DC | PRN
  Administered 2017-01-17: 2 mg via INTRAVENOUS

## 2017-01-17 MED ORDER — LIDOCAINE HCL (PF) 2 % IJ SOLN
INTRAMUSCULAR | Status: DC | PRN
  Administered 2017-01-17: .5 mL

## 2017-01-17 SURGICAL SUPPLY — 22 items
APPLICATOR COTTON TIP 3IN (MISCELLANEOUS) ×2 IMPLANT
CANNULA ANT/CHMB 27GA (MISCELLANEOUS) ×2 IMPLANT
DISSECTOR HYDRO NUCLEUS 50X22 (MISCELLANEOUS) ×2 IMPLANT
GLOVE BIO SURGEON STRL SZ7 (GLOVE) ×2 IMPLANT
GLOVE SURG LX 6.5 MICRO (GLOVE) ×1
GLOVE SURG LX STRL 6.5 MICRO (GLOVE) ×1 IMPLANT
GOWN STRL REUS W/ TWL LRG LVL3 (GOWN DISPOSABLE) ×2 IMPLANT
GOWN STRL REUS W/TWL LRG LVL3 (GOWN DISPOSABLE) ×2
LENS IOL ACRYSOF IQ 22.5 (Intraocular Lens) ×2 IMPLANT
MARKER SKIN DUAL TIP RULER LAB (MISCELLANEOUS) ×2 IMPLANT
PACK CATARACT BRASINGTON (MISCELLANEOUS) ×2 IMPLANT
PACK EYE AFTER SURG (MISCELLANEOUS) ×2 IMPLANT
PACK OPTHALMIC (MISCELLANEOUS) ×2 IMPLANT
RING MALYGIN 7.0 (MISCELLANEOUS) IMPLANT
SUT ETHILON 10-0 CS-B-6CS-B-6 (SUTURE)
SUT VICRYL  9 0 (SUTURE)
SUT VICRYL 9 0 (SUTURE) IMPLANT
SUTURE EHLN 10-0 CS-B-6CS-B-6 (SUTURE) IMPLANT
SYR TB 1ML LUER SLIP (SYRINGE) ×2 IMPLANT
WATER STERILE IRR 250ML POUR (IV SOLUTION) ×2 IMPLANT
WICK EYE OCUCEL (MISCELLANEOUS) IMPLANT
WIPE NON LINTING 3.25X3.25 (MISCELLANEOUS) ×2 IMPLANT

## 2017-01-17 NOTE — Anesthesia Postprocedure Evaluation (Signed)
Anesthesia Post Note  Patient: Antonio Villarreal.  Procedure(s) Performed: Procedure(s) (LRB): CATARACT EXTRACTION PHACO AND INTRAOCULAR LENS PLACEMENT (IOC)  left eye diabetic (Left)  Patient location during evaluation: PACU Anesthesia Type: MAC Level of consciousness: awake and alert Pain management: pain level controlled Vital Signs Assessment: post-procedure vital signs reviewed and stable Respiratory status: spontaneous breathing, nonlabored ventilation, respiratory function stable and patient connected to nasal cannula oxygen Cardiovascular status: stable and blood pressure returned to baseline Anesthetic complications: no    Trecia Rogers

## 2017-01-17 NOTE — Anesthesia Procedure Notes (Signed)
Procedure Name: MAC Performed by: Mayme Genta Pre-anesthesia Checklist: Patient identified, Emergency Drugs available, Suction available, Timeout performed and Patient being monitored Patient Re-evaluated:Patient Re-evaluated prior to inductionOxygen Delivery Method: Nasal cannula Placement Confirmation: positive ETCO2

## 2017-01-17 NOTE — Transfer of Care (Signed)
Immediate Anesthesia Transfer of Care Note  Patient: Antonio Favor Sr.  Procedure(s) Performed: Procedure(s) with comments: CATARACT EXTRACTION PHACO AND INTRAOCULAR LENS PLACEMENT (IOC)  left eye diabetic (Left) - diabetic - insulin  Patient Location: PACU  Anesthesia Type: MAC  Level of Consciousness: awake, alert  and patient cooperative  Airway and Oxygen Therapy: Patient Spontanous Breathing and Patient connected to supplemental oxygen  Post-op Assessment: Post-op Vital signs reviewed, Patient's Cardiovascular Status Stable, Respiratory Function Stable, Patent Airway and No signs of Nausea or vomiting  Post-op Vital Signs: Reviewed and stable  Complications: No apparent anesthesia complications

## 2017-01-17 NOTE — Anesthesia Preprocedure Evaluation (Signed)
Anesthesia Evaluation  Patient identified by MRN, date of birth, ID band Patient awake    Reviewed: Allergy & Precautions, H&P , NPO status , Patient's Chart, lab work & pertinent test results, reviewed documented beta blocker date and time   Airway Mallampati: I  TM Distance: >3 FB Neck ROM: full    Dental  (+) Upper Dentures, Lower Dentures   Pulmonary former smoker,    Pulmonary exam normal breath sounds clear to auscultation       Cardiovascular Exercise Tolerance: Good hypertension, + CAD and + CABG  Normal cardiovascular exam Rhythm:regular Rate:Normal     Neuro/Psych negative neurological ROS  negative psych ROS   GI/Hepatic negative GI ROS, Neg liver ROS,   Endo/Other  diabetes, Insulin Dependent  Renal/GU negative Renal ROS  negative genitourinary   Musculoskeletal   Abdominal   Peds  Hematology negative hematology ROS (+)   Anesthesia Other Findings   Reproductive/Obstetrics negative OB ROS                             Anesthesia Physical Anesthesia Plan  ASA: III  Anesthesia Plan: MAC   Post-op Pain Management:    Induction:   Airway Management Planned:   Additional Equipment:   Intra-op Plan:   Post-operative Plan:   Informed Consent: I have reviewed the patients History and Physical, chart, labs and discussed the procedure including the risks, benefits and alternatives for the proposed anesthesia with the patient or authorized representative who has indicated his/her understanding and acceptance.   Dental Advisory Given  Plan Discussed with: CRNA  Anesthesia Plan Comments:         Anesthesia Quick Evaluation

## 2017-01-17 NOTE — Op Note (Signed)
Date of Surgery: 01/17/2017  PREOPERATIVE DIAGNOSES: Visually significant nuclear sclerotic cataract, left eye.  POSTOPERATIVE DIAGNOSES: Same  PROCEDURES PERFORMED: Cataract extraction with intraocular lens implant, left eye.  SURGEON: Almon Hercules, M.D.  ANESTHESIA: MAC and topical  IMPLANTS: [id number] +22.5 D  Implant Name Type Inv. Item Serial No. Manufacturer Lot No. LRB No. Used  LENS IOL ACRYSOF IQ 22.5 - [id number] Intraocular Lens LENS IOL ACRYSOF IQ 22.5 [id number] [employer]CON  Left 1  LENS IOL ACRYSOF IQ 22.5 - [id number] Intraocular Lens LENS IOL ACRYSOF IQ 22.5 [id number] [employer]CON   Left 1    COMPLICATIONS: None.  DESCRIPTION OF PROCEDURE: Therapeutic options were discussed with the patient preoperatively, including a discussion of risks and benefits of surgery. Informed consent was obtained. An IOL-Master and immersion biometry were used to take the lens measurements, and a dilated fundus exam was performed within 6 months of the surgical date.  The patient was premedicated and brought to the operating room and placed on the operating table in the supine position. After adequate anesthesia, the patient was prepped and draped in the usual sterile ophthalmic fashion. A wire lid speculum was inserted and the microscope was positioned. A Superblade was used to create a paracentesis site at the limbus and a small amount of dilute preservative free lidocaine was instilled into the anterior chamber, followed by dispersive viscoelastic. A clear corneal incision was created temporally using a 2.4 mm keratome blade. Capsulorrhexis was then performed. In situ phacoemulsification was performed.  Cortical material was removed with the irrigation-aspiration unit. Dispersive viscoelastic was instilled to open the capsular bag. A posterior chamber intraocular lens with the specifications above was inserted and positioned. Irrigation-aspiration was used to remove all viscoelastic. Cefuroxime 1cc was  instilled into the anterior chamber, and the corneal incision was checked and found to be water tight. The eyelid speculum was removed.  The operative eye was covered with protective goggles after instilling 1 drop of timolol and brimonidine. The patient tolerated the procedure well. There were no complications.

## 2017-01-17 NOTE — H&P (Signed)
H+P reviewed and is up to date, please see paper chart.  

## 2017-01-18 ENCOUNTER — Encounter: Payer: Self-pay | Admitting: Ophthalmology

## 2017-01-19 ENCOUNTER — Encounter: Payer: Self-pay | Admitting: Ophthalmology

## 2017-02-10 DIAGNOSIS — M5134 Other intervertebral disc degeneration, thoracic region: Secondary | ICD-10-CM | POA: Diagnosis not present

## 2017-02-10 DIAGNOSIS — M9903 Segmental and somatic dysfunction of lumbar region: Secondary | ICD-10-CM | POA: Diagnosis not present

## 2017-02-10 DIAGNOSIS — M9902 Segmental and somatic dysfunction of thoracic region: Secondary | ICD-10-CM | POA: Diagnosis not present

## 2017-02-10 DIAGNOSIS — M5416 Radiculopathy, lumbar region: Secondary | ICD-10-CM | POA: Diagnosis not present

## 2017-02-15 DIAGNOSIS — I10 Essential (primary) hypertension: Secondary | ICD-10-CM | POA: Diagnosis not present

## 2017-02-15 DIAGNOSIS — I350 Nonrheumatic aortic (valve) stenosis: Secondary | ICD-10-CM | POA: Diagnosis not present

## 2017-02-15 DIAGNOSIS — I25810 Atherosclerosis of coronary artery bypass graft(s) without angina pectoris: Secondary | ICD-10-CM | POA: Diagnosis not present

## 2017-02-15 DIAGNOSIS — I340 Nonrheumatic mitral (valve) insufficiency: Secondary | ICD-10-CM | POA: Diagnosis not present

## 2017-02-15 DIAGNOSIS — E782 Mixed hyperlipidemia: Secondary | ICD-10-CM | POA: Diagnosis not present

## 2017-02-17 DIAGNOSIS — I10 Essential (primary) hypertension: Secondary | ICD-10-CM | POA: Diagnosis not present

## 2017-02-17 DIAGNOSIS — E119 Type 2 diabetes mellitus without complications: Secondary | ICD-10-CM | POA: Diagnosis not present

## 2017-02-17 DIAGNOSIS — E782 Mixed hyperlipidemia: Secondary | ICD-10-CM | POA: Diagnosis not present

## 2017-02-17 DIAGNOSIS — Z794 Long term (current) use of insulin: Secondary | ICD-10-CM | POA: Diagnosis not present

## 2017-03-16 DIAGNOSIS — M5416 Radiculopathy, lumbar region: Secondary | ICD-10-CM | POA: Diagnosis not present

## 2017-03-16 DIAGNOSIS — M9902 Segmental and somatic dysfunction of thoracic region: Secondary | ICD-10-CM | POA: Diagnosis not present

## 2017-03-16 DIAGNOSIS — M9903 Segmental and somatic dysfunction of lumbar region: Secondary | ICD-10-CM | POA: Diagnosis not present

## 2017-03-16 DIAGNOSIS — M5134 Other intervertebral disc degeneration, thoracic region: Secondary | ICD-10-CM | POA: Diagnosis not present

## 2017-04-13 DIAGNOSIS — M5134 Other intervertebral disc degeneration, thoracic region: Secondary | ICD-10-CM | POA: Diagnosis not present

## 2017-04-13 DIAGNOSIS — M9903 Segmental and somatic dysfunction of lumbar region: Secondary | ICD-10-CM | POA: Diagnosis not present

## 2017-04-13 DIAGNOSIS — M5416 Radiculopathy, lumbar region: Secondary | ICD-10-CM | POA: Diagnosis not present

## 2017-04-13 DIAGNOSIS — M9902 Segmental and somatic dysfunction of thoracic region: Secondary | ICD-10-CM | POA: Diagnosis not present

## 2017-05-02 DIAGNOSIS — M19071 Primary osteoarthritis, right ankle and foot: Secondary | ICD-10-CM | POA: Diagnosis not present

## 2017-05-11 DIAGNOSIS — M9903 Segmental and somatic dysfunction of lumbar region: Secondary | ICD-10-CM | POA: Diagnosis not present

## 2017-05-11 DIAGNOSIS — M9902 Segmental and somatic dysfunction of thoracic region: Secondary | ICD-10-CM | POA: Diagnosis not present

## 2017-05-11 DIAGNOSIS — M5416 Radiculopathy, lumbar region: Secondary | ICD-10-CM | POA: Diagnosis not present

## 2017-05-11 DIAGNOSIS — M5134 Other intervertebral disc degeneration, thoracic region: Secondary | ICD-10-CM | POA: Diagnosis not present

## 2017-05-20 DIAGNOSIS — E119 Type 2 diabetes mellitus without complications: Secondary | ICD-10-CM | POA: Diagnosis not present

## 2017-05-20 DIAGNOSIS — Z794 Long term (current) use of insulin: Secondary | ICD-10-CM | POA: Diagnosis not present

## 2017-05-20 DIAGNOSIS — Z125 Encounter for screening for malignant neoplasm of prostate: Secondary | ICD-10-CM | POA: Diagnosis not present

## 2017-05-20 DIAGNOSIS — E782 Mixed hyperlipidemia: Secondary | ICD-10-CM | POA: Diagnosis not present

## 2017-05-20 DIAGNOSIS — I10 Essential (primary) hypertension: Secondary | ICD-10-CM | POA: Diagnosis not present

## 2017-05-23 DIAGNOSIS — E782 Mixed hyperlipidemia: Secondary | ICD-10-CM | POA: Diagnosis not present

## 2017-05-23 DIAGNOSIS — E119 Type 2 diabetes mellitus without complications: Secondary | ICD-10-CM | POA: Diagnosis not present

## 2017-05-23 DIAGNOSIS — Z125 Encounter for screening for malignant neoplasm of prostate: Secondary | ICD-10-CM | POA: Diagnosis not present

## 2017-05-23 DIAGNOSIS — Z794 Long term (current) use of insulin: Secondary | ICD-10-CM | POA: Diagnosis not present

## 2017-06-08 DIAGNOSIS — M9902 Segmental and somatic dysfunction of thoracic region: Secondary | ICD-10-CM | POA: Diagnosis not present

## 2017-06-08 DIAGNOSIS — M5416 Radiculopathy, lumbar region: Secondary | ICD-10-CM | POA: Diagnosis not present

## 2017-06-08 DIAGNOSIS — M9903 Segmental and somatic dysfunction of lumbar region: Secondary | ICD-10-CM | POA: Diagnosis not present

## 2017-06-08 DIAGNOSIS — M5134 Other intervertebral disc degeneration, thoracic region: Secondary | ICD-10-CM | POA: Diagnosis not present

## 2017-07-06 DIAGNOSIS — M5134 Other intervertebral disc degeneration, thoracic region: Secondary | ICD-10-CM | POA: Diagnosis not present

## 2017-07-06 DIAGNOSIS — M5416 Radiculopathy, lumbar region: Secondary | ICD-10-CM | POA: Diagnosis not present

## 2017-07-06 DIAGNOSIS — M9902 Segmental and somatic dysfunction of thoracic region: Secondary | ICD-10-CM | POA: Diagnosis not present

## 2017-07-06 DIAGNOSIS — M9903 Segmental and somatic dysfunction of lumbar region: Secondary | ICD-10-CM | POA: Diagnosis not present

## 2017-08-03 DIAGNOSIS — M62830 Muscle spasm of back: Secondary | ICD-10-CM | POA: Diagnosis not present

## 2017-08-03 DIAGNOSIS — M5134 Other intervertebral disc degeneration, thoracic region: Secondary | ICD-10-CM | POA: Diagnosis not present

## 2017-08-03 DIAGNOSIS — M9903 Segmental and somatic dysfunction of lumbar region: Secondary | ICD-10-CM | POA: Diagnosis not present

## 2017-08-03 DIAGNOSIS — M9902 Segmental and somatic dysfunction of thoracic region: Secondary | ICD-10-CM | POA: Diagnosis not present

## 2017-08-10 DIAGNOSIS — I25708 Atherosclerosis of coronary artery bypass graft(s), unspecified, with other forms of angina pectoris: Secondary | ICD-10-CM | POA: Diagnosis not present

## 2017-08-10 DIAGNOSIS — E782 Mixed hyperlipidemia: Secondary | ICD-10-CM | POA: Diagnosis not present

## 2017-08-10 DIAGNOSIS — I350 Nonrheumatic aortic (valve) stenosis: Secondary | ICD-10-CM | POA: Diagnosis not present

## 2017-08-10 DIAGNOSIS — I10 Essential (primary) hypertension: Secondary | ICD-10-CM | POA: Diagnosis not present

## 2017-08-22 DIAGNOSIS — H401122 Primary open-angle glaucoma, left eye, moderate stage: Secondary | ICD-10-CM | POA: Diagnosis not present

## 2017-08-23 DIAGNOSIS — Z23 Encounter for immunization: Secondary | ICD-10-CM | POA: Diagnosis not present

## 2017-08-23 DIAGNOSIS — I10 Essential (primary) hypertension: Secondary | ICD-10-CM | POA: Diagnosis not present

## 2017-08-23 DIAGNOSIS — Z794 Long term (current) use of insulin: Secondary | ICD-10-CM | POA: Diagnosis not present

## 2017-08-23 DIAGNOSIS — E782 Mixed hyperlipidemia: Secondary | ICD-10-CM | POA: Diagnosis not present

## 2017-08-23 DIAGNOSIS — Z0000 Encounter for general adult medical examination without abnormal findings: Secondary | ICD-10-CM | POA: Diagnosis not present

## 2017-08-23 DIAGNOSIS — E119 Type 2 diabetes mellitus without complications: Secondary | ICD-10-CM | POA: Diagnosis not present

## 2017-08-31 DIAGNOSIS — M9903 Segmental and somatic dysfunction of lumbar region: Secondary | ICD-10-CM | POA: Diagnosis not present

## 2017-08-31 DIAGNOSIS — M9902 Segmental and somatic dysfunction of thoracic region: Secondary | ICD-10-CM | POA: Diagnosis not present

## 2017-08-31 DIAGNOSIS — M62830 Muscle spasm of back: Secondary | ICD-10-CM | POA: Diagnosis not present

## 2017-08-31 DIAGNOSIS — M5134 Other intervertebral disc degeneration, thoracic region: Secondary | ICD-10-CM | POA: Diagnosis not present

## 2017-09-20 DIAGNOSIS — E782 Mixed hyperlipidemia: Secondary | ICD-10-CM | POA: Diagnosis not present

## 2017-09-20 DIAGNOSIS — I350 Nonrheumatic aortic (valve) stenosis: Secondary | ICD-10-CM | POA: Diagnosis not present

## 2017-09-20 DIAGNOSIS — I10 Essential (primary) hypertension: Secondary | ICD-10-CM | POA: Diagnosis not present

## 2017-09-20 DIAGNOSIS — I25708 Atherosclerosis of coronary artery bypass graft(s), unspecified, with other forms of angina pectoris: Secondary | ICD-10-CM | POA: Diagnosis not present

## 2017-10-19 DIAGNOSIS — M9903 Segmental and somatic dysfunction of lumbar region: Secondary | ICD-10-CM | POA: Diagnosis not present

## 2017-10-19 DIAGNOSIS — M62830 Muscle spasm of back: Secondary | ICD-10-CM | POA: Diagnosis not present

## 2017-10-19 DIAGNOSIS — M9902 Segmental and somatic dysfunction of thoracic region: Secondary | ICD-10-CM | POA: Diagnosis not present

## 2017-10-19 DIAGNOSIS — M5134 Other intervertebral disc degeneration, thoracic region: Secondary | ICD-10-CM | POA: Diagnosis not present

## 2017-10-20 DIAGNOSIS — I350 Nonrheumatic aortic (valve) stenosis: Secondary | ICD-10-CM | POA: Diagnosis not present

## 2017-10-20 DIAGNOSIS — I25708 Atherosclerosis of coronary artery bypass graft(s), unspecified, with other forms of angina pectoris: Secondary | ICD-10-CM | POA: Diagnosis not present

## 2017-10-20 DIAGNOSIS — I10 Essential (primary) hypertension: Secondary | ICD-10-CM | POA: Diagnosis not present

## 2017-10-20 DIAGNOSIS — E782 Mixed hyperlipidemia: Secondary | ICD-10-CM | POA: Diagnosis not present

## 2017-10-20 DIAGNOSIS — E1159 Type 2 diabetes mellitus with other circulatory complications: Secondary | ICD-10-CM | POA: Diagnosis not present

## 2017-10-20 DIAGNOSIS — I340 Nonrheumatic mitral (valve) insufficiency: Secondary | ICD-10-CM | POA: Diagnosis not present

## 2017-11-23 DIAGNOSIS — M9902 Segmental and somatic dysfunction of thoracic region: Secondary | ICD-10-CM | POA: Diagnosis not present

## 2017-11-23 DIAGNOSIS — M9903 Segmental and somatic dysfunction of lumbar region: Secondary | ICD-10-CM | POA: Diagnosis not present

## 2017-11-23 DIAGNOSIS — M62830 Muscle spasm of back: Secondary | ICD-10-CM | POA: Diagnosis not present

## 2017-11-23 DIAGNOSIS — M5134 Other intervertebral disc degeneration, thoracic region: Secondary | ICD-10-CM | POA: Diagnosis not present

## 2017-12-01 DIAGNOSIS — E119 Type 2 diabetes mellitus without complications: Secondary | ICD-10-CM | POA: Diagnosis not present

## 2017-12-01 DIAGNOSIS — I10 Essential (primary) hypertension: Secondary | ICD-10-CM | POA: Diagnosis not present

## 2017-12-01 DIAGNOSIS — Z794 Long term (current) use of insulin: Secondary | ICD-10-CM | POA: Diagnosis not present

## 2017-12-01 DIAGNOSIS — E782 Mixed hyperlipidemia: Secondary | ICD-10-CM | POA: Diagnosis not present

## 2017-12-21 DIAGNOSIS — M9903 Segmental and somatic dysfunction of lumbar region: Secondary | ICD-10-CM | POA: Diagnosis not present

## 2017-12-21 DIAGNOSIS — M5134 Other intervertebral disc degeneration, thoracic region: Secondary | ICD-10-CM | POA: Diagnosis not present

## 2017-12-21 DIAGNOSIS — M9902 Segmental and somatic dysfunction of thoracic region: Secondary | ICD-10-CM | POA: Diagnosis not present

## 2017-12-21 DIAGNOSIS — M62830 Muscle spasm of back: Secondary | ICD-10-CM | POA: Diagnosis not present

## 2017-12-28 ENCOUNTER — Encounter: Payer: Self-pay | Admitting: Emergency Medicine

## 2017-12-28 ENCOUNTER — Emergency Department
Admission: EM | Admit: 2017-12-28 | Discharge: 2017-12-28 | Disposition: A | Discharge status: Home / Self Care | Payer: PPO | Attending: Emergency Medicine | Admitting: Emergency Medicine

## 2017-12-28 ENCOUNTER — Other Ambulatory Visit: Payer: Self-pay

## 2017-12-28 ENCOUNTER — Emergency Department: Payer: PPO

## 2017-12-28 DIAGNOSIS — R05 Cough: Secondary | ICD-10-CM | POA: Diagnosis not present

## 2017-12-28 DIAGNOSIS — J09X3 Influenza due to identified novel influenza A virus with gastrointestinal manifestations: Secondary | ICD-10-CM | POA: Diagnosis not present

## 2017-12-28 DIAGNOSIS — E119 Type 2 diabetes mellitus without complications: Secondary | ICD-10-CM | POA: Insufficient documentation

## 2017-12-28 DIAGNOSIS — I10 Essential (primary) hypertension: Secondary | ICD-10-CM | POA: Diagnosis not present

## 2017-12-28 DIAGNOSIS — Z87891 Personal history of nicotine dependence: Secondary | ICD-10-CM | POA: Insufficient documentation

## 2017-12-28 DIAGNOSIS — R112 Nausea with vomiting, unspecified: Secondary | ICD-10-CM | POA: Diagnosis not present

## 2017-12-28 DIAGNOSIS — R1110 Vomiting, unspecified: Secondary | ICD-10-CM | POA: Diagnosis present

## 2017-12-28 DIAGNOSIS — I2510 Atherosclerotic heart disease of native coronary artery without angina pectoris: Secondary | ICD-10-CM | POA: Insufficient documentation

## 2017-12-28 DIAGNOSIS — R509 Fever, unspecified: Secondary | ICD-10-CM | POA: Diagnosis not present

## 2017-12-28 DIAGNOSIS — R197 Diarrhea, unspecified: Secondary | ICD-10-CM | POA: Diagnosis not present

## 2017-12-28 DIAGNOSIS — J09X2 Influenza due to identified novel influenza A virus with other respiratory manifestations: Secondary | ICD-10-CM | POA: Diagnosis not present

## 2017-12-28 DIAGNOSIS — Z85819 Personal history of malignant neoplasm of unspecified site of lip, oral cavity, and pharynx: Secondary | ICD-10-CM | POA: Insufficient documentation

## 2017-12-28 DIAGNOSIS — J101 Influenza due to other identified influenza virus with other respiratory manifestations: Secondary | ICD-10-CM

## 2017-12-28 LAB — CBC
HCT: 41.5 % (ref 40.0–52.0)
Hemoglobin: 14.2 g/dL (ref 13.0–18.0)
MCH: 30 pg (ref 26.0–34.0)
MCHC: 34.3 g/dL (ref 32.0–36.0)
MCV: 87.5 fL (ref 80.0–100.0)
PLATELETS: 193 10*3/uL (ref 150–440)
RBC: 4.74 MIL/uL (ref 4.40–5.90)
RDW: 13.4 % (ref 11.5–14.5)
WBC: 8.8 10*3/uL (ref 3.8–10.6)

## 2017-12-28 LAB — COMPREHENSIVE METABOLIC PANEL
ALBUMIN: 3.8 g/dL (ref 3.5–5.0)
ALK PHOS: 44 U/L (ref 38–126)
ALT: 28 U/L (ref 17–63)
AST: 45 U/L — ABNORMAL HIGH (ref 15–41)
Anion gap: 12 (ref 5–15)
BUN: 36 mg/dL — ABNORMAL HIGH (ref 6–20)
CALCIUM: 8.9 mg/dL (ref 8.9–10.3)
CO2: 19 mmol/L — AB (ref 22–32)
CREATININE: 1.22 mg/dL (ref 0.61–1.24)
Chloride: 101 mmol/L (ref 101–111)
GFR calc non Af Amer: 55 mL/min — ABNORMAL LOW (ref >60)
GLUCOSE: 176 mg/dL — AB (ref 65–99)
Potassium: 4 mmol/L (ref 3.5–5.1)
SODIUM: 132 mmol/L — AB (ref 135–145)
Total Bilirubin: 1.4 mg/dL — ABNORMAL HIGH (ref 0.3–1.2)
Total Protein: 7.9 g/dL (ref 6.5–8.1)

## 2017-12-28 LAB — URINALYSIS, COMPLETE (UACMP) WITH MICROSCOPIC
Bacteria, UA: NONE SEEN
Bilirubin Urine: NEGATIVE
GLUCOSE, UA: NEGATIVE mg/dL
Ketones, ur: 20 mg/dL — AB
Leukocytes, UA: NEGATIVE
Nitrite: NEGATIVE
PH: 5 (ref 5.0–8.0)
Protein, ur: 30 mg/dL — AB
Specific Gravity, Urine: 1.024 (ref 1.005–1.030)
Squamous Epithelial / LPF: NONE SEEN

## 2017-12-28 LAB — INFLUENZA PANEL BY PCR (TYPE A & B)
Influenza A By PCR: POSITIVE — AB
Influenza B By PCR: NEGATIVE

## 2017-12-28 LAB — LIPASE, BLOOD: Lipase: 31 U/L (ref 11–51)

## 2017-12-28 MED ORDER — KETOROLAC TROMETHAMINE 30 MG/ML IJ SOLN
15.0000 mg | Freq: Once | INTRAMUSCULAR | Status: AC
  Administered 2017-12-28: 15 mg via INTRAVENOUS
  Filled 2017-12-28: qty 1

## 2017-12-28 MED ORDER — ONDANSETRON HCL 4 MG/2ML IJ SOLN
4.0000 mg | Freq: Once | INTRAMUSCULAR | Status: DC
  Filled 2017-12-28: qty 2

## 2017-12-28 MED ORDER — OSELTAMIVIR PHOSPHATE 75 MG PO CAPS
75.0000 mg | ORAL_CAPSULE | Freq: Once | ORAL | Status: AC
Start: 2017-12-28 — End: 2017-12-28
  Administered 2017-12-28: 75 mg via ORAL
  Filled 2017-12-28: qty 1

## 2017-12-28 MED ORDER — OSELTAMIVIR PHOSPHATE 75 MG PO CAPS: 75.0000 mg | ORAL_CAPSULE | Freq: Two times a day (BID) | ORAL | 0 refills | Status: AC

## 2017-12-28 MED ORDER — SODIUM CHLORIDE 0.9 % IV SOLN
Freq: Once | INTRAVENOUS | Status: AC
  Administered 2017-12-28: 09:00:00 via INTRAVENOUS

## 2017-12-28 MED ORDER — SODIUM CHLORIDE 0.9 % IV BOLUS (SEPSIS)
1000.0000 mL | Freq: Once | INTRAVENOUS | Status: AC
  Administered 2017-12-28: 1000 mL via INTRAVENOUS

## 2017-12-28 NOTE — ED Notes (Signed)
Pt alert and oriented X4, active, cooperative, pt in NAD. RR even and unlabored, color WNL.  Pt informed to return if any life threatening symptoms occur.  Discharge and followup instructions reviewed.  

## 2017-12-28 NOTE — ED Notes (Addendum)
Pt attempting to provide urine sample

## 2017-12-28 NOTE — ED Notes (Signed)
Pt able to pee successfully.
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
## 2017-12-28 NOTE — ED Triage Notes (Signed)
Pt here for fever, NVD, body aches and cough since Saturday. Unlabored. Is diabetic but sugars have been 140 or less per pt.

## 2017-12-30 DIAGNOSIS — E860 Dehydration: Secondary | ICD-10-CM | POA: Diagnosis not present

## 2017-12-30 DIAGNOSIS — J101 Influenza due to other identified influenza virus with other respiratory manifestations: Secondary | ICD-10-CM | POA: Diagnosis not present

## 2018-01-01 ENCOUNTER — Observation Stay
Admission: EM | Admit: 2018-01-01 | Discharge: 2018-01-02 | Disposition: A | Discharge status: Home / Self Care | Payer: PPO | Attending: Internal Medicine | Admitting: Internal Medicine

## 2018-01-01 ENCOUNTER — Encounter: Payer: Self-pay | Admitting: Emergency Medicine

## 2018-01-01 DIAGNOSIS — R0602 Shortness of breath: Secondary | ICD-10-CM | POA: Diagnosis not present

## 2018-01-01 DIAGNOSIS — Z951 Presence of aortocoronary bypass graft: Secondary | ICD-10-CM | POA: Diagnosis not present

## 2018-01-01 DIAGNOSIS — Z7982 Long term (current) use of aspirin: Secondary | ICD-10-CM | POA: Diagnosis not present

## 2018-01-01 DIAGNOSIS — J189 Pneumonia, unspecified organism: Secondary | ICD-10-CM | POA: Diagnosis present

## 2018-01-01 DIAGNOSIS — A419 Sepsis, unspecified organism: Secondary | ICD-10-CM | POA: Insufficient documentation

## 2018-01-01 DIAGNOSIS — Z79899 Other long term (current) drug therapy: Secondary | ICD-10-CM | POA: Insufficient documentation

## 2018-01-01 DIAGNOSIS — Z85828 Personal history of other malignant neoplasm of skin: Secondary | ICD-10-CM | POA: Diagnosis not present

## 2018-01-01 DIAGNOSIS — J1000 Influenza due to other identified influenza virus with unspecified type of pneumonia: Secondary | ICD-10-CM | POA: Diagnosis not present

## 2018-01-01 DIAGNOSIS — Z794 Long term (current) use of insulin: Secondary | ICD-10-CM | POA: Diagnosis not present

## 2018-01-01 DIAGNOSIS — E119 Type 2 diabetes mellitus without complications: Secondary | ICD-10-CM | POA: Insufficient documentation

## 2018-01-01 DIAGNOSIS — I2510 Atherosclerotic heart disease of native coronary artery without angina pectoris: Secondary | ICD-10-CM | POA: Diagnosis not present

## 2018-01-01 DIAGNOSIS — J181 Lobar pneumonia, unspecified organism: Secondary | ICD-10-CM | POA: Diagnosis not present

## 2018-01-01 DIAGNOSIS — I10 Essential (primary) hypertension: Secondary | ICD-10-CM | POA: Insufficient documentation

## 2018-01-01 DIAGNOSIS — R0902 Hypoxemia: Secondary | ICD-10-CM

## 2018-01-01 DIAGNOSIS — Z87891 Personal history of nicotine dependence: Secondary | ICD-10-CM | POA: Diagnosis not present

## 2018-01-01 DIAGNOSIS — E782 Mixed hyperlipidemia: Secondary | ICD-10-CM | POA: Diagnosis present

## 2018-01-01 DIAGNOSIS — R509 Fever, unspecified: Secondary | ICD-10-CM | POA: Diagnosis not present

## 2018-01-01 DIAGNOSIS — N400 Enlarged prostate without lower urinary tract symptoms: Secondary | ICD-10-CM | POA: Diagnosis present

## 2018-01-01 DIAGNOSIS — J101 Influenza due to other identified influenza virus with other respiratory manifestations: Secondary | ICD-10-CM | POA: Diagnosis present

## 2018-01-01 DIAGNOSIS — E785 Hyperlipidemia, unspecified: Secondary | ICD-10-CM | POA: Diagnosis not present

## 2018-01-01 NOTE — ED Triage Notes (Signed)
Patient brought in by ems from home. Patient was diagnosed with flu four day ago. Patient started feeling worse. Patient with complaint of shortness of breath that started yesterday. Per ems patient oxygen saturation 88% on room air with excretion. O2 at 4l placed by ems. Per ems patient temperature 102. Patient was given 640 mg of tylenol given in route.

## 2018-01-02 ENCOUNTER — Other Ambulatory Visit: Payer: Self-pay

## 2018-01-02 ENCOUNTER — Emergency Department: Payer: PPO

## 2018-01-02 DIAGNOSIS — J101 Influenza due to other identified influenza virus with other respiratory manifestations: Secondary | ICD-10-CM | POA: Diagnosis not present

## 2018-01-02 DIAGNOSIS — J189 Pneumonia, unspecified organism: Secondary | ICD-10-CM | POA: Diagnosis present

## 2018-01-02 DIAGNOSIS — A419 Sepsis, unspecified organism: Secondary | ICD-10-CM | POA: Diagnosis not present

## 2018-01-02 DIAGNOSIS — I10 Essential (primary) hypertension: Secondary | ICD-10-CM | POA: Diagnosis not present

## 2018-01-02 DIAGNOSIS — R0602 Shortness of breath: Secondary | ICD-10-CM | POA: Diagnosis not present

## 2018-01-02 LAB — GLUCOSE, CAPILLARY
GLUCOSE-CAPILLARY: 166 mg/dL — AB (ref 65–99)
GLUCOSE-CAPILLARY: 152 mg/dL — AB (ref 65–99)
Glucose-Capillary: 128 mg/dL — ABNORMAL HIGH (ref 65–99)

## 2018-01-02 LAB — URINALYSIS, COMPLETE (UACMP) WITH MICROSCOPIC
Bacteria, UA: NONE SEEN
Bilirubin Urine: NEGATIVE
GLUCOSE, UA: NEGATIVE mg/dL
HGB URINE DIPSTICK: NEGATIVE
KETONES UR: 5 mg/dL — AB
Leukocytes, UA: NEGATIVE
Nitrite: NEGATIVE
PH: 7 (ref 5.0–8.0)
PROTEIN: 30 mg/dL — AB
Specific Gravity, Urine: 1.016 (ref 1.005–1.030)
Squamous Epithelial / LPF: NONE SEEN

## 2018-01-02 LAB — CBC WITH DIFFERENTIAL/PLATELET
Basophils Absolute: 0.1 10*3/uL (ref 0–0.1)
Basophils Relative: 1 %
EOS PCT: 1 %
Eosinophils Absolute: 0.1 10*3/uL (ref 0–0.7)
HEMATOCRIT: 37.1 % — AB (ref 40.0–52.0)
Hemoglobin: 13 g/dL (ref 13.0–18.0)
LYMPHS ABS: 1.3 10*3/uL (ref 1.0–3.6)
LYMPHS PCT: 16 %
MCH: 29.8 pg (ref 26.0–34.0)
MCHC: 34.9 g/dL (ref 32.0–36.0)
MCV: 85.5 fL (ref 80.0–100.0)
MONO ABS: 1.1 10*3/uL — AB (ref 0.2–1.0)
Monocytes Relative: 13 %
Neutro Abs: 5.9 10*3/uL (ref 1.4–6.5)
Neutrophils Relative %: 69 %
PLATELETS: 280 10*3/uL (ref 150–440)
RBC: 4.34 MIL/uL — ABNORMAL LOW (ref 4.40–5.90)
RDW: 13.7 % (ref 11.5–14.5)
WBC: 8.4 10*3/uL (ref 3.8–10.6)

## 2018-01-02 LAB — TROPONIN I

## 2018-01-02 LAB — CBC
HCT: 36.3 % — ABNORMAL LOW (ref 40.0–52.0)
HEMOGLOBIN: 12.5 g/dL — AB (ref 13.0–18.0)
MCH: 29.8 pg (ref 26.0–34.0)
MCHC: 34.5 g/dL (ref 32.0–36.0)
MCV: 86.5 fL (ref 80.0–100.0)
PLATELETS: 284 10*3/uL (ref 150–440)
RBC: 4.2 MIL/uL — AB (ref 4.40–5.90)
RDW: 13.4 % (ref 11.5–14.5)
WBC: 8.9 10*3/uL (ref 3.8–10.6)

## 2018-01-02 LAB — COMPREHENSIVE METABOLIC PANEL
ALBUMIN: 3.3 g/dL — AB (ref 3.5–5.0)
ALT: 34 U/L (ref 17–63)
AST: 42 U/L — ABNORMAL HIGH (ref 15–41)
Alkaline Phosphatase: 83 U/L (ref 38–126)
Anion gap: 13 (ref 5–15)
BUN: 20 mg/dL (ref 6–20)
CHLORIDE: 104 mmol/L (ref 101–111)
CO2: 18 mmol/L — AB (ref 22–32)
Calcium: 8.6 mg/dL — ABNORMAL LOW (ref 8.9–10.3)
Creatinine, Ser: 0.92 mg/dL (ref 0.61–1.24)
GFR calc Af Amer: >60 mL/min (ref >60)
GFR calc non Af Amer: >60 mL/min (ref >60)
GLUCOSE: 161 mg/dL — AB (ref 65–99)
POTASSIUM: 3.5 mmol/L (ref 3.5–5.1)
SODIUM: 135 mmol/L (ref 135–145)
Total Bilirubin: 1.3 mg/dL — ABNORMAL HIGH (ref 0.3–1.2)
Total Protein: 7.7 g/dL (ref 6.5–8.1)

## 2018-01-02 LAB — BASIC METABOLIC PANEL
ANION GAP: 10 (ref 5–15)
BUN: 18 mg/dL (ref 6–20)
CALCIUM: 8.6 mg/dL — AB (ref 8.9–10.3)
CHLORIDE: 103 mmol/L (ref 101–111)
CO2: 23 mmol/L (ref 22–32)
CREATININE: 1.1 mg/dL (ref 0.61–1.24)
GFR calc non Af Amer: >60 mL/min (ref >60)
Glucose, Bld: 189 mg/dL — ABNORMAL HIGH (ref 65–99)
Potassium: 3.6 mmol/L (ref 3.5–5.1)
Sodium: 136 mmol/L (ref 135–145)

## 2018-01-02 LAB — LACTIC ACID, PLASMA
LACTIC ACID, VENOUS: 1.3 mmol/L (ref 0.5–1.9)
Lactic Acid, Venous: 1.4 mmol/L (ref 0.5–1.9)

## 2018-01-02 LAB — BRAIN NATRIURETIC PEPTIDE: B NATRIURETIC PEPTIDE 5: 342 pg/mL — AB (ref 0.0–100.0)

## 2018-01-02 MED ORDER — ISOSORBIDE MONONITRATE ER 30 MG PO TB24
30.0000 mg | ORAL_TABLET | Freq: Every day | ORAL | Status: DC
  Administered 2018-01-02: 30 mg via ORAL
  Filled 2018-01-02 (×2): qty 1

## 2018-01-02 MED ORDER — CEFTRIAXONE SODIUM 1 G IJ SOLR
1.0000 g | Freq: Once | INTRAMUSCULAR | Status: AC
  Administered 2018-01-02: 1 g via INTRAVENOUS
  Filled 2018-01-02: qty 10

## 2018-01-02 MED ORDER — ALBUTEROL SULFATE HFA 108 (90 BASE) MCG/ACT IN AERS: 2.0000 | INHALATION_SPRAY | Freq: Four times a day (QID) | RESPIRATORY_TRACT | 0 refills | Status: DC | PRN

## 2018-01-02 MED ORDER — DEXTROSE 5 % IV SOLN: 500.0000 mg | INTRAVENOUS | Status: DC

## 2018-01-02 MED ORDER — AZITHROMYCIN 500 MG IV SOLR
500.0000 mg | INTRAVENOUS | Status: DC
  Filled 2018-01-02: qty 500

## 2018-01-02 MED ORDER — ENOXAPARIN SODIUM 40 MG/0.4ML ~~LOC~~ SOLN: 40.0000 mg | SUBCUTANEOUS | Status: DC

## 2018-01-02 MED ORDER — ASPIRIN EC 81 MG PO TBEC
81.0000 mg | DELAYED_RELEASE_TABLET | Freq: Every day | ORAL | Status: DC
  Administered 2018-01-02: 10:00:00 81 mg via ORAL
  Filled 2018-01-02: qty 1

## 2018-01-02 MED ORDER — ACETAMINOPHEN 650 MG RE SUPP: 650.0000 mg | Freq: Four times a day (QID) | RECTAL | Status: DC | PRN

## 2018-01-02 MED ORDER — ACETAMINOPHEN 325 MG PO TABS
650.0000 mg | ORAL_TABLET | Freq: Four times a day (QID) | ORAL | Status: DC | PRN
  Administered 2018-01-02: 05:00:00 650 mg via ORAL
  Filled 2018-01-02: qty 2

## 2018-01-02 MED ORDER — ROSUVASTATIN CALCIUM 10 MG PO TABS
5.0000 mg | ORAL_TABLET | Freq: Every day | ORAL | Status: DC
  Administered 2018-01-02: 5 mg via ORAL
  Filled 2018-01-02 (×2): qty 1

## 2018-01-02 MED ORDER — INSULIN ASPART 100 UNIT/ML ~~LOC~~ SOLN: 0.0000 [IU] | Freq: Every day | SUBCUTANEOUS | Status: DC

## 2018-01-02 MED ORDER — LEVOFLOXACIN 500 MG PO TABS: 500.0000 mg | ORAL_TABLET | Freq: Every day | ORAL | 0 refills | Status: AC

## 2018-01-02 MED ORDER — ONDANSETRON HCL 4 MG PO TABS: 4.0000 mg | ORAL_TABLET | Freq: Four times a day (QID) | ORAL | Status: DC | PRN

## 2018-01-02 MED ORDER — DEXTROSE 5 % IV SOLN
500.0000 mg | Freq: Once | INTRAVENOUS | Status: AC
  Administered 2018-01-02: 500 mg via INTRAVENOUS
  Filled 2018-01-02: qty 500

## 2018-01-02 MED ORDER — LISINOPRIL 5 MG PO TABS
2.5000 mg | ORAL_TABLET | Freq: Every day | ORAL | Status: DC
  Administered 2018-01-02: 2.5 mg via ORAL
  Filled 2018-01-02: qty 1

## 2018-01-02 MED ORDER — LATANOPROST 0.005 % OP SOLN
1.0000 [drp] | Freq: Two times a day (BID) | OPHTHALMIC | Status: DC
  Administered 2018-01-02: 10:00:00 1 [drp] via OPHTHALMIC
  Filled 2018-01-02: qty 2.5

## 2018-01-02 MED ORDER — GUAIFENESIN-DM 100-10 MG/5ML PO SYRP
5.0000 mL | ORAL_SOLUTION | ORAL | Status: DC | PRN
  Administered 2018-01-02 (×2): 5 mL via ORAL
  Filled 2018-01-02 (×3): qty 5

## 2018-01-02 MED ORDER — OSELTAMIVIR PHOSPHATE 75 MG PO CAPS
75.0000 mg | ORAL_CAPSULE | Freq: Two times a day (BID) | ORAL | Status: AC
  Administered 2018-01-02: 02:00:00 75 mg via ORAL
  Filled 2018-01-02: qty 1

## 2018-01-02 MED ORDER — DEXTROSE 5 % IV SOLN: 1.0000 g | INTRAVENOUS | Status: DC

## 2018-01-02 MED ORDER — NYSTATIN 100000 UNIT/ML MT SUSP
5.0000 mL | Freq: Four times a day (QID) | OROMUCOSAL | Status: DC
Start: 2018-01-02 — End: 2018-01-02
  Administered 2018-01-02 (×2): 500000 [IU] via ORAL
  Filled 2018-01-02 (×2): qty 5

## 2018-01-02 MED ORDER — INSULIN ASPART 100 UNIT/ML ~~LOC~~ SOLN
0.0000 [IU] | Freq: Three times a day (TID) | SUBCUTANEOUS | Status: DC
  Administered 2018-01-02: 09:00:00 1 [IU] via SUBCUTANEOUS
  Administered 2018-01-02: 12:00:00 2 [IU] via SUBCUTANEOUS
  Filled 2018-01-02 (×2): qty 1

## 2018-01-02 MED ORDER — SODIUM CHLORIDE 0.9 % IV SOLN
1.0000 g | INTRAVENOUS | Status: DC
  Filled 2018-01-02: qty 10

## 2018-01-02 MED ORDER — ONDANSETRON HCL 4 MG/2ML IJ SOLN: 4.0000 mg | Freq: Four times a day (QID) | INTRAMUSCULAR | Status: DC | PRN

## 2018-01-02 NOTE — Care Management Note (Signed)
Case Management Note  Patient Details  Name: Antonio Elahi Sr. MRN: [id number] Date of Birth: October 10, 1941  Subjective/Objective:   Admitted to [facility] with the diagnosis of sepsis. Lives with wife, Marcie Bal [phone]). Prescriptions are filled at [facility] on [facility]. No home Health. No skilled facility. No home oxygen. Takes care of all basic activities of daily living himself, drives. No falls. Decreased appetite x 4 weeks. Lost 10 pounds.                 Action/Plan: no follow-up needs identified at this time.   Expected Discharge Date:                  Expected Discharge Plan:     In-House Referral:   yes  Discharge planning Services     Post Acute Care Choice:    Choice offered to:     DME Arranged:    DME Agency:     HH Arranged:    HH Agency:     Status of Service:     If discussed at [employer], dates discussed:    Additional Comments:  Shelbie Ammons, RN MSN CCM Care Management [phone] 01/02/2018, 10:51 AM
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
## 2018-01-02 NOTE — Progress Notes (Signed)
CODE SEPSIS - PHARMACY COMMUNICATION  **Broad Spectrum Antibiotics should be administered within 1 hour of Sepsis diagnosis**  Time Code Sepsis Called/Page Received: 2/11 0003  Antibiotics Ordered: azithromycin, ceftriaxone 2/11 0003  Time of 1st antibiotic administration: 2/11 0015  Additional action taken by pharmacy:   If necessary, Name of Provider/Nurse Contacted:     Eloise Harman ,PharmD Clinical Pharmacist  01/02/2018  12:43 AM
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
## 2018-01-02 NOTE — Discharge Instructions (Signed)
Resume diet and activity as before ° ° °

## 2018-01-02 NOTE — Progress Notes (Signed)
Pharmacy Antibiotic Note  Antonio Stoudt. is a 77 y.o. male admitted on 01/01/2018 with CAP.  Pharmacy has been consulted for azithromycin and ceftriaxone dosing.  Plan: Azithromycin 500 mg q 24 hours ordered. Ceftriaxone 1 gram q 24 hours ordered.  Height: 5\' 10"  (177.8 cm) Weight: 194 lb (88 kg) IBW/kg (Calculated) : 73  Temp (24hrs), Avg:100.1 F (37.8 C), Min:100.1 F (37.8 C), Max:100.1 F (37.8 C)  Recent Labs  Lab 12/28/17 0826  WBC 8.8  CREATININE 1.22    Estimated Creatinine Clearance: 55.8 mL/min (by C-G formula based on SCr of 1.22 mg/dL).    Allergies  Allergen Reactions  . Alphagan [Brimonidine] Itching and Swelling    eyes  . Januvia [Sitagliptin] Other (See Comments)    GI Upset  . Timolol Itching and Swelling    eyes  . Morphine And Related Itching and Rash    Antimicrobials this admission: Azithromycin, ceftriaxone 2/11  >>    >>   Dose adjustments this admission:   Microbiology results: 2/11 BCx: pending   Thank you for allowing pharmacy to be a part of this patient's care.  Samreet [place]field S 01/02/2018 12:09 AM

## 2018-01-02 NOTE — Care Management Obs Status (Signed)
[facility] NOTIFICATION   Patient Details  Name: Antonio Dunaj Sr. MRN: [id number] Date of Birth: 1941-11-04   Medicare Observation Status Notification Given:  Yes    Shelbie Ammons, RN 01/02/2018, 10:23 AM
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
## 2018-01-07 LAB — CULTURE, BLOOD (ROUTINE X 2)
CULTURE: NO GROWTH
Culture: NO GROWTH

## 2018-01-11 DIAGNOSIS — J181 Lobar pneumonia, unspecified organism: Secondary | ICD-10-CM | POA: Diagnosis not present

## 2018-01-18 DIAGNOSIS — M9903 Segmental and somatic dysfunction of lumbar region: Secondary | ICD-10-CM | POA: Diagnosis not present

## 2018-01-18 DIAGNOSIS — M62830 Muscle spasm of back: Secondary | ICD-10-CM | POA: Diagnosis not present

## 2018-01-18 DIAGNOSIS — M5134 Other intervertebral disc degeneration, thoracic region: Secondary | ICD-10-CM | POA: Diagnosis not present

## 2018-01-18 DIAGNOSIS — M9902 Segmental and somatic dysfunction of thoracic region: Secondary | ICD-10-CM | POA: Diagnosis not present

## 2018-02-14 DIAGNOSIS — I10 Essential (primary) hypertension: Secondary | ICD-10-CM | POA: Diagnosis not present

## 2018-02-14 DIAGNOSIS — I350 Nonrheumatic aortic (valve) stenosis: Secondary | ICD-10-CM | POA: Diagnosis not present

## 2018-02-14 DIAGNOSIS — I25708 Atherosclerosis of coronary artery bypass graft(s), unspecified, with other forms of angina pectoris: Secondary | ICD-10-CM | POA: Diagnosis not present

## 2018-02-14 DIAGNOSIS — E1159 Type 2 diabetes mellitus with other circulatory complications: Secondary | ICD-10-CM | POA: Diagnosis not present

## 2018-02-14 DIAGNOSIS — I340 Nonrheumatic mitral (valve) insufficiency: Secondary | ICD-10-CM | POA: Diagnosis not present

## 2018-02-15 DIAGNOSIS — M62830 Muscle spasm of back: Secondary | ICD-10-CM | POA: Diagnosis not present

## 2018-02-15 DIAGNOSIS — M9903 Segmental and somatic dysfunction of lumbar region: Secondary | ICD-10-CM | POA: Diagnosis not present

## 2018-02-15 DIAGNOSIS — M5134 Other intervertebral disc degeneration, thoracic region: Secondary | ICD-10-CM | POA: Diagnosis not present

## 2018-02-15 DIAGNOSIS — M9902 Segmental and somatic dysfunction of thoracic region: Secondary | ICD-10-CM | POA: Diagnosis not present

## 2018-02-20 DIAGNOSIS — H2511 Age-related nuclear cataract, right eye: Secondary | ICD-10-CM | POA: Diagnosis not present

## 2018-03-06 DIAGNOSIS — E119 Type 2 diabetes mellitus without complications: Secondary | ICD-10-CM | POA: Diagnosis not present

## 2018-03-06 DIAGNOSIS — Z794 Long term (current) use of insulin: Secondary | ICD-10-CM | POA: Diagnosis not present

## 2018-03-06 DIAGNOSIS — E782 Mixed hyperlipidemia: Secondary | ICD-10-CM | POA: Diagnosis not present

## 2018-03-06 DIAGNOSIS — I10 Essential (primary) hypertension: Secondary | ICD-10-CM | POA: Diagnosis not present

## 2018-03-22 DIAGNOSIS — M5134 Other intervertebral disc degeneration, thoracic region: Secondary | ICD-10-CM | POA: Diagnosis not present

## 2018-03-22 DIAGNOSIS — M9902 Segmental and somatic dysfunction of thoracic region: Secondary | ICD-10-CM | POA: Diagnosis not present

## 2018-03-22 DIAGNOSIS — M62830 Muscle spasm of back: Secondary | ICD-10-CM | POA: Diagnosis not present

## 2018-03-22 DIAGNOSIS — M9903 Segmental and somatic dysfunction of lumbar region: Secondary | ICD-10-CM | POA: Diagnosis not present

## 2018-04-19 DIAGNOSIS — M9902 Segmental and somatic dysfunction of thoracic region: Secondary | ICD-10-CM | POA: Diagnosis not present

## 2018-04-19 DIAGNOSIS — M62830 Muscle spasm of back: Secondary | ICD-10-CM | POA: Diagnosis not present

## 2018-04-19 DIAGNOSIS — M5134 Other intervertebral disc degeneration, thoracic region: Secondary | ICD-10-CM | POA: Diagnosis not present

## 2018-04-19 DIAGNOSIS — M9903 Segmental and somatic dysfunction of lumbar region: Secondary | ICD-10-CM | POA: Diagnosis not present

## 2018-05-01 DIAGNOSIS — M19071 Primary osteoarthritis, right ankle and foot: Secondary | ICD-10-CM | POA: Diagnosis not present

## 2018-05-17 DIAGNOSIS — M5134 Other intervertebral disc degeneration, thoracic region: Secondary | ICD-10-CM | POA: Diagnosis not present

## 2018-05-17 DIAGNOSIS — M9902 Segmental and somatic dysfunction of thoracic region: Secondary | ICD-10-CM | POA: Diagnosis not present

## 2018-05-17 DIAGNOSIS — M62830 Muscle spasm of back: Secondary | ICD-10-CM | POA: Diagnosis not present

## 2018-05-17 DIAGNOSIS — M9903 Segmental and somatic dysfunction of lumbar region: Secondary | ICD-10-CM | POA: Diagnosis not present

## 2018-06-14 DIAGNOSIS — M9903 Segmental and somatic dysfunction of lumbar region: Secondary | ICD-10-CM | POA: Diagnosis not present

## 2018-06-14 DIAGNOSIS — M5134 Other intervertebral disc degeneration, thoracic region: Secondary | ICD-10-CM | POA: Diagnosis not present

## 2018-06-14 DIAGNOSIS — M62830 Muscle spasm of back: Secondary | ICD-10-CM | POA: Diagnosis not present

## 2018-06-14 DIAGNOSIS — M9902 Segmental and somatic dysfunction of thoracic region: Secondary | ICD-10-CM | POA: Diagnosis not present

## 2018-06-15 DIAGNOSIS — I350 Nonrheumatic aortic (valve) stenosis: Secondary | ICD-10-CM | POA: Diagnosis not present

## 2018-06-15 DIAGNOSIS — E1159 Type 2 diabetes mellitus with other circulatory complications: Secondary | ICD-10-CM | POA: Diagnosis not present

## 2018-06-15 DIAGNOSIS — I25708 Atherosclerosis of coronary artery bypass graft(s), unspecified, with other forms of angina pectoris: Secondary | ICD-10-CM | POA: Diagnosis not present

## 2018-06-15 DIAGNOSIS — I10 Essential (primary) hypertension: Secondary | ICD-10-CM | POA: Diagnosis not present

## 2018-06-15 DIAGNOSIS — I340 Nonrheumatic mitral (valve) insufficiency: Secondary | ICD-10-CM | POA: Diagnosis not present

## 2018-06-15 DIAGNOSIS — E782 Mixed hyperlipidemia: Secondary | ICD-10-CM | POA: Diagnosis not present

## 2018-06-16 DIAGNOSIS — Z794 Long term (current) use of insulin: Secondary | ICD-10-CM | POA: Diagnosis not present

## 2018-06-16 DIAGNOSIS — E669 Obesity, unspecified: Secondary | ICD-10-CM | POA: Diagnosis not present

## 2018-06-16 DIAGNOSIS — I10 Essential (primary) hypertension: Secondary | ICD-10-CM | POA: Diagnosis not present

## 2018-06-16 DIAGNOSIS — E119 Type 2 diabetes mellitus without complications: Secondary | ICD-10-CM | POA: Diagnosis not present

## 2018-06-16 DIAGNOSIS — E782 Mixed hyperlipidemia: Secondary | ICD-10-CM | POA: Diagnosis not present

## 2018-07-11 DIAGNOSIS — M62830 Muscle spasm of back: Secondary | ICD-10-CM | POA: Diagnosis not present

## 2018-07-11 DIAGNOSIS — M9903 Segmental and somatic dysfunction of lumbar region: Secondary | ICD-10-CM | POA: Diagnosis not present

## 2018-07-11 DIAGNOSIS — M9902 Segmental and somatic dysfunction of thoracic region: Secondary | ICD-10-CM | POA: Diagnosis not present

## 2018-07-11 DIAGNOSIS — M5134 Other intervertebral disc degeneration, thoracic region: Secondary | ICD-10-CM | POA: Diagnosis not present

## 2018-07-17 DIAGNOSIS — H26492 Other secondary cataract, left eye: Secondary | ICD-10-CM | POA: Diagnosis not present

## 2018-07-17 DIAGNOSIS — E113393 Type 2 diabetes mellitus with moderate nonproliferative diabetic retinopathy without macular edema, bilateral: Secondary | ICD-10-CM | POA: Diagnosis not present

## 2018-08-07 DIAGNOSIS — M62830 Muscle spasm of back: Secondary | ICD-10-CM | POA: Diagnosis not present

## 2018-08-07 DIAGNOSIS — M9903 Segmental and somatic dysfunction of lumbar region: Secondary | ICD-10-CM | POA: Diagnosis not present

## 2018-08-07 DIAGNOSIS — M5134 Other intervertebral disc degeneration, thoracic region: Secondary | ICD-10-CM | POA: Diagnosis not present

## 2018-08-07 DIAGNOSIS — M9902 Segmental and somatic dysfunction of thoracic region: Secondary | ICD-10-CM | POA: Diagnosis not present

## 2018-08-16 DIAGNOSIS — E113293 Type 2 diabetes mellitus with mild nonproliferative diabetic retinopathy without macular edema, bilateral: Secondary | ICD-10-CM | POA: Diagnosis not present

## 2018-09-04 DIAGNOSIS — M5134 Other intervertebral disc degeneration, thoracic region: Secondary | ICD-10-CM | POA: Diagnosis not present

## 2018-09-04 DIAGNOSIS — M62830 Muscle spasm of back: Secondary | ICD-10-CM | POA: Diagnosis not present

## 2018-09-04 DIAGNOSIS — M9902 Segmental and somatic dysfunction of thoracic region: Secondary | ICD-10-CM | POA: Diagnosis not present

## 2018-09-04 DIAGNOSIS — M9903 Segmental and somatic dysfunction of lumbar region: Secondary | ICD-10-CM | POA: Diagnosis not present

## 2018-10-04 DIAGNOSIS — M62830 Muscle spasm of back: Secondary | ICD-10-CM | POA: Diagnosis not present

## 2018-10-04 DIAGNOSIS — M5134 Other intervertebral disc degeneration, thoracic region: Secondary | ICD-10-CM | POA: Diagnosis not present

## 2018-10-04 DIAGNOSIS — M9903 Segmental and somatic dysfunction of lumbar region: Secondary | ICD-10-CM | POA: Diagnosis not present

## 2018-10-04 DIAGNOSIS — M9902 Segmental and somatic dysfunction of thoracic region: Secondary | ICD-10-CM | POA: Diagnosis not present

## 2018-10-16 DIAGNOSIS — I350 Nonrheumatic aortic (valve) stenosis: Secondary | ICD-10-CM | POA: Diagnosis not present

## 2018-10-16 DIAGNOSIS — I25810 Atherosclerosis of coronary artery bypass graft(s) without angina pectoris: Secondary | ICD-10-CM | POA: Diagnosis not present

## 2018-10-16 DIAGNOSIS — I10 Essential (primary) hypertension: Secondary | ICD-10-CM | POA: Diagnosis not present

## 2018-10-16 DIAGNOSIS — E782 Mixed hyperlipidemia: Secondary | ICD-10-CM | POA: Diagnosis not present

## 2018-10-30 DIAGNOSIS — M9903 Segmental and somatic dysfunction of lumbar region: Secondary | ICD-10-CM | POA: Diagnosis not present

## 2018-10-30 DIAGNOSIS — M62830 Muscle spasm of back: Secondary | ICD-10-CM | POA: Diagnosis not present

## 2018-10-30 DIAGNOSIS — M5134 Other intervertebral disc degeneration, thoracic region: Secondary | ICD-10-CM | POA: Diagnosis not present

## 2018-10-30 DIAGNOSIS — M9902 Segmental and somatic dysfunction of thoracic region: Secondary | ICD-10-CM | POA: Diagnosis not present

## 2018-11-06 DIAGNOSIS — I714 Abdominal aortic aneurysm, without rupture, unspecified: Secondary | ICD-10-CM | POA: Insufficient documentation

## 2018-11-06 DIAGNOSIS — I350 Nonrheumatic aortic (valve) stenosis: Secondary | ICD-10-CM | POA: Insufficient documentation

## 2018-11-06 DIAGNOSIS — I10 Essential (primary) hypertension: Secondary | ICD-10-CM | POA: Diagnosis not present

## 2018-11-06 DIAGNOSIS — I25810 Atherosclerosis of coronary artery bypass graft(s) without angina pectoris: Secondary | ICD-10-CM | POA: Diagnosis not present

## 2018-11-22 DIAGNOSIS — I714 Abdominal aortic aneurysm, without rupture: Secondary | ICD-10-CM

## 2018-11-22 DIAGNOSIS — I7143 Infrarenal abdominal aortic aneurysm, without rupture: Secondary | ICD-10-CM

## 2018-11-22 HISTORY — DX: Infrarenal abdominal aortic aneurysm, without rupture: I71.43

## 2018-11-22 HISTORY — DX: Abdominal aortic aneurysm, without rupture: I71.4

## 2018-11-27 DIAGNOSIS — M9903 Segmental and somatic dysfunction of lumbar region: Secondary | ICD-10-CM | POA: Diagnosis not present

## 2018-11-27 DIAGNOSIS — M5134 Other intervertebral disc degeneration, thoracic region: Secondary | ICD-10-CM | POA: Diagnosis not present

## 2018-11-27 DIAGNOSIS — M9902 Segmental and somatic dysfunction of thoracic region: Secondary | ICD-10-CM | POA: Diagnosis not present

## 2018-11-27 DIAGNOSIS — M62830 Muscle spasm of back: Secondary | ICD-10-CM | POA: Diagnosis not present

## 2018-12-19 DIAGNOSIS — Z794 Long term (current) use of insulin: Secondary | ICD-10-CM | POA: Diagnosis not present

## 2018-12-19 DIAGNOSIS — E119 Type 2 diabetes mellitus without complications: Secondary | ICD-10-CM | POA: Diagnosis not present

## 2018-12-19 DIAGNOSIS — E782 Mixed hyperlipidemia: Secondary | ICD-10-CM | POA: Diagnosis not present

## 2018-12-22 DIAGNOSIS — E669 Obesity, unspecified: Secondary | ICD-10-CM | POA: Diagnosis not present

## 2018-12-22 DIAGNOSIS — I10 Essential (primary) hypertension: Secondary | ICD-10-CM | POA: Diagnosis not present

## 2018-12-22 DIAGNOSIS — E782 Mixed hyperlipidemia: Secondary | ICD-10-CM | POA: Diagnosis not present

## 2018-12-22 DIAGNOSIS — Z794 Long term (current) use of insulin: Secondary | ICD-10-CM | POA: Diagnosis not present

## 2018-12-22 DIAGNOSIS — E119 Type 2 diabetes mellitus without complications: Secondary | ICD-10-CM | POA: Diagnosis not present

## 2018-12-22 DIAGNOSIS — I350 Nonrheumatic aortic (valve) stenosis: Secondary | ICD-10-CM | POA: Diagnosis not present

## 2018-12-25 DIAGNOSIS — M5134 Other intervertebral disc degeneration, thoracic region: Secondary | ICD-10-CM | POA: Diagnosis not present

## 2018-12-25 DIAGNOSIS — M62830 Muscle spasm of back: Secondary | ICD-10-CM | POA: Diagnosis not present

## 2018-12-25 DIAGNOSIS — M9903 Segmental and somatic dysfunction of lumbar region: Secondary | ICD-10-CM | POA: Diagnosis not present

## 2018-12-25 DIAGNOSIS — M9902 Segmental and somatic dysfunction of thoracic region: Secondary | ICD-10-CM | POA: Diagnosis not present

## 2019-01-19 DIAGNOSIS — I714 Abdominal aortic aneurysm, without rupture: Secondary | ICD-10-CM | POA: Diagnosis not present

## 2019-01-19 DIAGNOSIS — I350 Nonrheumatic aortic (valve) stenosis: Secondary | ICD-10-CM | POA: Diagnosis not present

## 2019-01-19 DIAGNOSIS — I2510 Atherosclerotic heart disease of native coronary artery without angina pectoris: Secondary | ICD-10-CM | POA: Diagnosis not present

## 2019-01-19 DIAGNOSIS — E782 Mixed hyperlipidemia: Secondary | ICD-10-CM | POA: Diagnosis not present

## 2019-01-19 DIAGNOSIS — Z01818 Encounter for other preprocedural examination: Secondary | ICD-10-CM | POA: Diagnosis not present

## 2019-01-19 DIAGNOSIS — I10 Essential (primary) hypertension: Secondary | ICD-10-CM | POA: Diagnosis not present

## 2019-01-22 DIAGNOSIS — M9902 Segmental and somatic dysfunction of thoracic region: Secondary | ICD-10-CM | POA: Diagnosis not present

## 2019-01-22 DIAGNOSIS — H401122 Primary open-angle glaucoma, left eye, moderate stage: Secondary | ICD-10-CM | POA: Diagnosis not present

## 2019-01-22 DIAGNOSIS — M62830 Muscle spasm of back: Secondary | ICD-10-CM | POA: Diagnosis not present

## 2019-01-22 DIAGNOSIS — M9903 Segmental and somatic dysfunction of lumbar region: Secondary | ICD-10-CM | POA: Diagnosis not present

## 2019-01-22 DIAGNOSIS — M5134 Other intervertebral disc degeneration, thoracic region: Secondary | ICD-10-CM | POA: Diagnosis not present

## 2019-01-29 DIAGNOSIS — I208 Other forms of angina pectoris: Secondary | ICD-10-CM | POA: Diagnosis present

## 2019-02-01 ENCOUNTER — Ambulatory Visit
Admission: RE | Admit: 2019-02-01 | Discharge: 2019-02-01 | Disposition: A | Discharge status: Home / Self Care | Payer: PPO | Attending: Internal Medicine | Admitting: Internal Medicine

## 2019-02-01 ENCOUNTER — Other Ambulatory Visit: Payer: Self-pay

## 2019-02-01 ENCOUNTER — Encounter
Admission: RE | Disposition: A | Discharge status: Home / Self Care | Payer: Self-pay | Source: Home / Self Care | Attending: Internal Medicine

## 2019-02-01 DIAGNOSIS — Z794 Long term (current) use of insulin: Secondary | ICD-10-CM | POA: Diagnosis not present

## 2019-02-01 DIAGNOSIS — I714 Abdominal aortic aneurysm, without rupture: Secondary | ICD-10-CM | POA: Insufficient documentation

## 2019-02-01 DIAGNOSIS — I2510 Atherosclerotic heart disease of native coronary artery without angina pectoris: Secondary | ICD-10-CM | POA: Insufficient documentation

## 2019-02-01 DIAGNOSIS — I208 Other forms of angina pectoris: Secondary | ICD-10-CM | POA: Diagnosis present

## 2019-02-01 DIAGNOSIS — Z7982 Long term (current) use of aspirin: Secondary | ICD-10-CM | POA: Insufficient documentation

## 2019-02-01 DIAGNOSIS — I77810 Thoracic aortic ectasia: Secondary | ICD-10-CM

## 2019-02-01 DIAGNOSIS — I10 Essential (primary) hypertension: Secondary | ICD-10-CM | POA: Insufficient documentation

## 2019-02-01 DIAGNOSIS — Z79899 Other long term (current) drug therapy: Secondary | ICD-10-CM | POA: Insufficient documentation

## 2019-02-01 DIAGNOSIS — Z87891 Personal history of nicotine dependence: Secondary | ICD-10-CM | POA: Insufficient documentation

## 2019-02-01 DIAGNOSIS — E119 Type 2 diabetes mellitus without complications: Secondary | ICD-10-CM | POA: Insufficient documentation

## 2019-02-01 DIAGNOSIS — I2089 Other forms of angina pectoris: Secondary | ICD-10-CM | POA: Diagnosis present

## 2019-02-01 DIAGNOSIS — E785 Hyperlipidemia, unspecified: Secondary | ICD-10-CM | POA: Insufficient documentation

## 2019-02-01 DIAGNOSIS — I350 Nonrheumatic aortic (valve) stenosis: Secondary | ICD-10-CM | POA: Diagnosis not present

## 2019-02-01 DIAGNOSIS — I25798 Atherosclerosis of other coronary artery bypass graft(s) with other forms of angina pectoris: Secondary | ICD-10-CM | POA: Diagnosis not present

## 2019-02-01 HISTORY — PX: RIGHT/LEFT HEART CATH AND CORONARY/GRAFT ANGIOGRAPHY: CATH118267

## 2019-02-01 HISTORY — DX: Thoracic aortic ectasia: I77.810

## 2019-02-01 LAB — GLUCOSE, CAPILLARY
Glucose-Capillary: 88 mg/dL (ref 70–99)
Glucose-Capillary: 89 mg/dL (ref 70–99)

## 2019-02-01 SURGERY — RIGHT/LEFT HEART CATH AND CORONARY/GRAFT ANGIOGRAPHY
Anesthesia: Moderate Sedation

## 2019-02-01 MED ORDER — HEPARIN (PORCINE) IN NACL 1000-0.9 UT/500ML-% IV SOLN
INTRAVENOUS | Status: AC
  Filled 2019-02-01: qty 1000

## 2019-02-01 MED ORDER — SODIUM CHLORIDE 0.9 % WEIGHT BASED INFUSION: 1.0000 mL/kg/h | INTRAVENOUS | Status: DC

## 2019-02-01 MED ORDER — ASPIRIN 81 MG PO CHEW
CHEWABLE_TABLET | ORAL | Status: AC
  Filled 2019-02-01: qty 1

## 2019-02-01 MED ORDER — IOPAMIDOL (ISOVUE-300) INJECTION 61%
INTRAVENOUS | Status: DC | PRN
  Administered 2019-02-01: 200 mL via INTRA_ARTERIAL

## 2019-02-01 MED ORDER — SODIUM CHLORIDE 0.9% FLUSH: 3.0000 mL | Freq: Two times a day (BID) | INTRAVENOUS | Status: DC

## 2019-02-01 MED ORDER — FENTANYL CITRATE (PF) 100 MCG/2ML IJ SOLN
INTRAMUSCULAR | Status: DC | PRN
  Administered 2019-02-01: 25 ug via INTRAVENOUS

## 2019-02-01 MED ORDER — ASPIRIN 81 MG PO CHEW
81.0000 mg | CHEWABLE_TABLET | ORAL | Status: AC
  Administered 2019-02-01: 81 mg via ORAL

## 2019-02-01 MED ORDER — SODIUM CHLORIDE 0.9% FLUSH: 3.0000 mL | INTRAVENOUS | Status: DC | PRN

## 2019-02-01 MED ORDER — MIDAZOLAM HCL 2 MG/2ML IJ SOLN
INTRAMUSCULAR | Status: DC | PRN
  Administered 2019-02-01: 1 mg via INTRAVENOUS

## 2019-02-01 MED ORDER — SODIUM CHLORIDE 0.9 % WEIGHT BASED INFUSION
3.0000 mL/kg/h | INTRAVENOUS | Status: AC
  Administered 2019-02-01: 3 mL/kg/h via INTRAVENOUS

## 2019-02-01 MED ORDER — SODIUM CHLORIDE 0.9 % IV SOLN: 250.0000 mL | INTRAVENOUS | Status: DC | PRN

## 2019-02-01 MED ORDER — FENTANYL CITRATE (PF) 100 MCG/2ML IJ SOLN
INTRAMUSCULAR | Status: AC
  Filled 2019-02-01: qty 2

## 2019-02-01 MED ORDER — MIDAZOLAM HCL 2 MG/2ML IJ SOLN
INTRAMUSCULAR | Status: AC
  Filled 2019-02-01: qty 2

## 2019-02-01 SURGICAL SUPPLY — 18 items
CATH INFINITI 5 FR IM (CATHETERS) ×3 IMPLANT
CATH INFINITI 5 FR LCB (CATHETERS) ×3 IMPLANT
CATH INFINITI 5 FR RCB (CATHETERS) ×3 IMPLANT
CATH INFINITI 5FR ANG PIGTAIL (CATHETERS) ×3 IMPLANT
CATH INFINITI 5FR JL4 (CATHETERS) ×3 IMPLANT
CATH INFINITI 5FR JL5 (CATHETERS) ×3 IMPLANT
CATH INFINITI JR4 5F (CATHETERS) ×3 IMPLANT
CATH SWANZ 7F THERMO (CATHETERS) ×3 IMPLANT
DEVICE CLOSURE MYNXGRIP 6/7F (Vascular Products) ×3 IMPLANT
KIT MANI 3VAL PERCEP (MISCELLANEOUS) ×3 IMPLANT
KIT RIGHT HEART (MISCELLANEOUS) ×3 IMPLANT
NEEDLE PERC 18GX7CM (NEEDLE) ×6 IMPLANT
PACK CARDIAC CATH (CUSTOM PROCEDURE TRAY) ×3 IMPLANT
SHEATH AVANTI 6FR X 11CM (SHEATH) ×3 IMPLANT
SHEATH AVANTI 7FRX11 (SHEATH) ×3 IMPLANT
WIRE EMERALD 3MM-J .035X260CM (WIRE) ×3 IMPLANT
WIRE EMERALD ST .035X150CM (WIRE) ×3 IMPLANT
WIRE GUIDERIGHT .035X150 (WIRE) ×6 IMPLANT

## 2019-02-01 NOTE — Progress Notes (Signed)
Epic Downtime this am. MD orders placed per Dr. [name] by hand, in shadow chart. Epic back up in postprocedure.

## 2019-02-05 ENCOUNTER — Encounter: Payer: Self-pay | Admitting: Internal Medicine

## 2019-02-07 DIAGNOSIS — Z01818 Encounter for other preprocedural examination: Secondary | ICD-10-CM | POA: Diagnosis not present

## 2019-02-07 DIAGNOSIS — I6523 Occlusion and stenosis of bilateral carotid arteries: Secondary | ICD-10-CM | POA: Diagnosis not present

## 2019-02-07 DIAGNOSIS — I350 Nonrheumatic aortic (valve) stenosis: Secondary | ICD-10-CM | POA: Diagnosis not present

## 2019-02-15 DIAGNOSIS — I350 Nonrheumatic aortic (valve) stenosis: Secondary | ICD-10-CM | POA: Diagnosis not present

## 2019-02-15 DIAGNOSIS — I10 Essential (primary) hypertension: Secondary | ICD-10-CM | POA: Diagnosis not present

## 2019-02-15 DIAGNOSIS — I2510 Atherosclerotic heart disease of native coronary artery without angina pectoris: Secondary | ICD-10-CM | POA: Diagnosis not present

## 2019-02-15 DIAGNOSIS — E782 Mixed hyperlipidemia: Secondary | ICD-10-CM | POA: Diagnosis not present

## 2019-02-19 DIAGNOSIS — M9902 Segmental and somatic dysfunction of thoracic region: Secondary | ICD-10-CM | POA: Diagnosis not present

## 2019-02-19 DIAGNOSIS — M9903 Segmental and somatic dysfunction of lumbar region: Secondary | ICD-10-CM | POA: Diagnosis not present

## 2019-02-19 DIAGNOSIS — M5134 Other intervertebral disc degeneration, thoracic region: Secondary | ICD-10-CM | POA: Diagnosis not present

## 2019-02-19 DIAGNOSIS — M62830 Muscle spasm of back: Secondary | ICD-10-CM | POA: Diagnosis not present

## 2019-03-09 DIAGNOSIS — E1159 Type 2 diabetes mellitus with other circulatory complications: Secondary | ICD-10-CM | POA: Diagnosis not present

## 2019-03-09 DIAGNOSIS — E119 Type 2 diabetes mellitus without complications: Secondary | ICD-10-CM | POA: Diagnosis not present

## 2019-03-09 DIAGNOSIS — Z1159 Encounter for screening for other viral diseases: Secondary | ICD-10-CM | POA: Diagnosis not present

## 2019-03-09 DIAGNOSIS — Z006 Encounter for examination for normal comparison and control in clinical research program: Secondary | ICD-10-CM | POA: Diagnosis not present

## 2019-03-09 DIAGNOSIS — Z794 Long term (current) use of insulin: Secondary | ICD-10-CM | POA: Diagnosis not present

## 2019-03-09 DIAGNOSIS — Z952 Presence of prosthetic heart valve: Secondary | ICD-10-CM | POA: Diagnosis not present

## 2019-03-09 DIAGNOSIS — Z79899 Other long term (current) drug therapy: Secondary | ICD-10-CM | POA: Diagnosis not present

## 2019-03-09 DIAGNOSIS — E1151 Type 2 diabetes mellitus with diabetic peripheral angiopathy without gangrene: Secondary | ICD-10-CM | POA: Diagnosis not present

## 2019-03-09 DIAGNOSIS — N400 Enlarged prostate without lower urinary tract symptoms: Secondary | ICD-10-CM | POA: Diagnosis not present

## 2019-03-09 DIAGNOSIS — Z951 Presence of aortocoronary bypass graft: Secondary | ICD-10-CM | POA: Diagnosis not present

## 2019-03-09 DIAGNOSIS — I350 Nonrheumatic aortic (valve) stenosis: Secondary | ICD-10-CM | POA: Diagnosis not present

## 2019-03-09 DIAGNOSIS — Z888 Allergy status to other drugs, medicaments and biological substances status: Secondary | ICD-10-CM | POA: Diagnosis not present

## 2019-03-09 DIAGNOSIS — Z7982 Long term (current) use of aspirin: Secondary | ICD-10-CM | POA: Diagnosis not present

## 2019-03-09 DIAGNOSIS — M19079 Primary osteoarthritis, unspecified ankle and foot: Secondary | ICD-10-CM | POA: Diagnosis not present

## 2019-03-09 DIAGNOSIS — I517 Cardiomegaly: Secondary | ICD-10-CM | POA: Diagnosis not present

## 2019-03-09 DIAGNOSIS — I739 Peripheral vascular disease, unspecified: Secondary | ICD-10-CM | POA: Diagnosis not present

## 2019-03-09 DIAGNOSIS — Z86010 Personal history of colonic polyps: Secondary | ICD-10-CM | POA: Diagnosis not present

## 2019-03-09 DIAGNOSIS — N289 Disorder of kidney and ureter, unspecified: Secondary | ICD-10-CM | POA: Diagnosis not present

## 2019-03-09 DIAGNOSIS — E782 Mixed hyperlipidemia: Secondary | ICD-10-CM | POA: Diagnosis not present

## 2019-03-09 DIAGNOSIS — Z885 Allergy status to narcotic agent status: Secondary | ICD-10-CM | POA: Diagnosis not present

## 2019-03-09 DIAGNOSIS — Z96661 Presence of right artificial ankle joint: Secondary | ICD-10-CM | POA: Diagnosis not present

## 2019-03-09 DIAGNOSIS — Z87891 Personal history of nicotine dependence: Secondary | ICD-10-CM | POA: Diagnosis not present

## 2019-03-09 DIAGNOSIS — I700 Atherosclerosis of aorta: Secondary | ICD-10-CM | POA: Diagnosis not present

## 2019-03-09 DIAGNOSIS — R001 Bradycardia, unspecified: Secondary | ICD-10-CM | POA: Diagnosis not present

## 2019-03-09 DIAGNOSIS — I10 Essential (primary) hypertension: Secondary | ICD-10-CM | POA: Diagnosis not present

## 2019-03-09 DIAGNOSIS — I714 Abdominal aortic aneurysm, without rupture: Secondary | ICD-10-CM | POA: Diagnosis not present

## 2019-03-09 DIAGNOSIS — R918 Other nonspecific abnormal finding of lung field: Secondary | ICD-10-CM | POA: Diagnosis not present

## 2019-03-09 DIAGNOSIS — I2510 Atherosclerotic heart disease of native coronary artery without angina pectoris: Secondary | ICD-10-CM | POA: Diagnosis not present

## 2019-03-09 DIAGNOSIS — I340 Nonrheumatic mitral (valve) insufficiency: Secondary | ICD-10-CM | POA: Diagnosis not present

## 2019-03-13 HISTORY — PX: CARDIAC VALVE REPLACEMENT: SHX585

## 2019-03-13 HISTORY — PX: TRANSCATHETER AORTIC VALVE REPLACEMENT, TRANSFEMORAL: SHX6400

## 2019-03-15 DIAGNOSIS — Z952 Presence of prosthetic heart valve: Secondary | ICD-10-CM | POA: Insufficient documentation

## 2019-03-26 DIAGNOSIS — M9902 Segmental and somatic dysfunction of thoracic region: Secondary | ICD-10-CM | POA: Diagnosis not present

## 2019-03-26 DIAGNOSIS — M9903 Segmental and somatic dysfunction of lumbar region: Secondary | ICD-10-CM | POA: Diagnosis not present

## 2019-03-26 DIAGNOSIS — M5134 Other intervertebral disc degeneration, thoracic region: Secondary | ICD-10-CM | POA: Diagnosis not present

## 2019-03-26 DIAGNOSIS — M62830 Muscle spasm of back: Secondary | ICD-10-CM | POA: Diagnosis not present

## 2019-04-03 DIAGNOSIS — I110 Hypertensive heart disease with heart failure: Secondary | ICD-10-CM | POA: Diagnosis not present

## 2019-04-03 DIAGNOSIS — Z952 Presence of prosthetic heart valve: Secondary | ICD-10-CM | POA: Diagnosis not present

## 2019-04-03 DIAGNOSIS — I2510 Atherosclerotic heart disease of native coronary artery without angina pectoris: Secondary | ICD-10-CM | POA: Diagnosis not present

## 2019-04-03 DIAGNOSIS — I350 Nonrheumatic aortic (valve) stenosis: Secondary | ICD-10-CM | POA: Diagnosis not present

## 2019-04-03 DIAGNOSIS — I5089 Other heart failure: Secondary | ICD-10-CM | POA: Diagnosis not present

## 2019-04-03 DIAGNOSIS — Z8249 Family history of ischemic heart disease and other diseases of the circulatory system: Secondary | ICD-10-CM | POA: Diagnosis not present

## 2019-04-03 DIAGNOSIS — Z951 Presence of aortocoronary bypass graft: Secondary | ICD-10-CM | POA: Diagnosis not present

## 2019-04-03 DIAGNOSIS — Z48812 Encounter for surgical aftercare following surgery on the circulatory system: Secondary | ICD-10-CM | POA: Diagnosis not present

## 2019-04-03 DIAGNOSIS — Z79899 Other long term (current) drug therapy: Secondary | ICD-10-CM | POA: Diagnosis not present

## 2019-04-03 DIAGNOSIS — Z87891 Personal history of nicotine dependence: Secondary | ICD-10-CM | POA: Diagnosis not present

## 2019-04-23 DIAGNOSIS — M9903 Segmental and somatic dysfunction of lumbar region: Secondary | ICD-10-CM | POA: Diagnosis not present

## 2019-04-23 DIAGNOSIS — M9902 Segmental and somatic dysfunction of thoracic region: Secondary | ICD-10-CM | POA: Diagnosis not present

## 2019-04-23 DIAGNOSIS — M5134 Other intervertebral disc degeneration, thoracic region: Secondary | ICD-10-CM | POA: Diagnosis not present

## 2019-04-23 DIAGNOSIS — M62830 Muscle spasm of back: Secondary | ICD-10-CM | POA: Diagnosis not present

## 2019-04-26 DIAGNOSIS — I10 Essential (primary) hypertension: Secondary | ICD-10-CM | POA: Diagnosis not present

## 2019-04-26 DIAGNOSIS — Z952 Presence of prosthetic heart valve: Secondary | ICD-10-CM | POA: Diagnosis not present

## 2019-04-26 DIAGNOSIS — I2510 Atherosclerotic heart disease of native coronary artery without angina pectoris: Secondary | ICD-10-CM | POA: Diagnosis not present

## 2019-04-26 DIAGNOSIS — E782 Mixed hyperlipidemia: Secondary | ICD-10-CM | POA: Diagnosis not present

## 2019-05-21 DIAGNOSIS — M9902 Segmental and somatic dysfunction of thoracic region: Secondary | ICD-10-CM | POA: Diagnosis not present

## 2019-05-21 DIAGNOSIS — M5134 Other intervertebral disc degeneration, thoracic region: Secondary | ICD-10-CM | POA: Diagnosis not present

## 2019-05-21 DIAGNOSIS — M9903 Segmental and somatic dysfunction of lumbar region: Secondary | ICD-10-CM | POA: Diagnosis not present

## 2019-05-21 DIAGNOSIS — M62830 Muscle spasm of back: Secondary | ICD-10-CM | POA: Diagnosis not present

## 2019-06-04 DIAGNOSIS — Z96661 Presence of right artificial ankle joint: Secondary | ICD-10-CM | POA: Diagnosis not present

## 2019-06-04 DIAGNOSIS — M19071 Primary osteoarthritis, right ankle and foot: Secondary | ICD-10-CM | POA: Diagnosis not present

## 2019-06-18 DIAGNOSIS — M9902 Segmental and somatic dysfunction of thoracic region: Secondary | ICD-10-CM | POA: Diagnosis not present

## 2019-06-18 DIAGNOSIS — M62830 Muscle spasm of back: Secondary | ICD-10-CM | POA: Diagnosis not present

## 2019-06-18 DIAGNOSIS — M9903 Segmental and somatic dysfunction of lumbar region: Secondary | ICD-10-CM | POA: Diagnosis not present

## 2019-06-18 DIAGNOSIS — M5134 Other intervertebral disc degeneration, thoracic region: Secondary | ICD-10-CM | POA: Diagnosis not present

## 2019-06-26 DIAGNOSIS — I10 Essential (primary) hypertension: Secondary | ICD-10-CM | POA: Diagnosis not present

## 2019-06-26 DIAGNOSIS — I350 Nonrheumatic aortic (valve) stenosis: Secondary | ICD-10-CM | POA: Diagnosis not present

## 2019-06-26 DIAGNOSIS — I714 Abdominal aortic aneurysm, without rupture: Secondary | ICD-10-CM | POA: Diagnosis not present

## 2019-06-26 DIAGNOSIS — Z952 Presence of prosthetic heart valve: Secondary | ICD-10-CM | POA: Diagnosis not present

## 2019-06-26 DIAGNOSIS — E782 Mixed hyperlipidemia: Secondary | ICD-10-CM | POA: Diagnosis not present

## 2019-06-26 DIAGNOSIS — I2510 Atherosclerotic heart disease of native coronary artery without angina pectoris: Secondary | ICD-10-CM | POA: Diagnosis not present

## 2019-06-26 DIAGNOSIS — I340 Nonrheumatic mitral (valve) insufficiency: Secondary | ICD-10-CM | POA: Diagnosis not present

## 2019-07-02 DIAGNOSIS — Z794 Long term (current) use of insulin: Secondary | ICD-10-CM | POA: Diagnosis not present

## 2019-07-02 DIAGNOSIS — E119 Type 2 diabetes mellitus without complications: Secondary | ICD-10-CM | POA: Diagnosis not present

## 2019-07-02 DIAGNOSIS — Z0000 Encounter for general adult medical examination without abnormal findings: Secondary | ICD-10-CM | POA: Diagnosis not present

## 2019-07-02 DIAGNOSIS — Z952 Presence of prosthetic heart valve: Secondary | ICD-10-CM | POA: Diagnosis not present

## 2019-07-02 DIAGNOSIS — I714 Abdominal aortic aneurysm, without rupture: Secondary | ICD-10-CM | POA: Diagnosis not present

## 2019-07-02 DIAGNOSIS — I10 Essential (primary) hypertension: Secondary | ICD-10-CM | POA: Diagnosis not present

## 2019-07-02 DIAGNOSIS — E782 Mixed hyperlipidemia: Secondary | ICD-10-CM | POA: Diagnosis not present

## 2019-07-18 DIAGNOSIS — M9902 Segmental and somatic dysfunction of thoracic region: Secondary | ICD-10-CM | POA: Diagnosis not present

## 2019-07-18 DIAGNOSIS — M5134 Other intervertebral disc degeneration, thoracic region: Secondary | ICD-10-CM | POA: Diagnosis not present

## 2019-07-18 DIAGNOSIS — M9903 Segmental and somatic dysfunction of lumbar region: Secondary | ICD-10-CM | POA: Diagnosis not present

## 2019-07-18 DIAGNOSIS — M62830 Muscle spasm of back: Secondary | ICD-10-CM | POA: Diagnosis not present

## 2019-07-23 DIAGNOSIS — H401122 Primary open-angle glaucoma, left eye, moderate stage: Secondary | ICD-10-CM | POA: Diagnosis not present

## 2019-08-06 DIAGNOSIS — H2511 Age-related nuclear cataract, right eye: Secondary | ICD-10-CM | POA: Diagnosis not present

## 2019-08-13 DIAGNOSIS — M9902 Segmental and somatic dysfunction of thoracic region: Secondary | ICD-10-CM | POA: Diagnosis not present

## 2019-08-13 DIAGNOSIS — M62830 Muscle spasm of back: Secondary | ICD-10-CM | POA: Diagnosis not present

## 2019-08-13 DIAGNOSIS — M5134 Other intervertebral disc degeneration, thoracic region: Secondary | ICD-10-CM | POA: Diagnosis not present

## 2019-08-13 DIAGNOSIS — M9903 Segmental and somatic dysfunction of lumbar region: Secondary | ICD-10-CM | POA: Diagnosis not present

## 2019-09-10 DIAGNOSIS — M9902 Segmental and somatic dysfunction of thoracic region: Secondary | ICD-10-CM | POA: Diagnosis not present

## 2019-09-10 DIAGNOSIS — M62830 Muscle spasm of back: Secondary | ICD-10-CM | POA: Diagnosis not present

## 2019-09-10 DIAGNOSIS — M9903 Segmental and somatic dysfunction of lumbar region: Secondary | ICD-10-CM | POA: Diagnosis not present

## 2019-09-10 DIAGNOSIS — M5134 Other intervertebral disc degeneration, thoracic region: Secondary | ICD-10-CM | POA: Diagnosis not present

## 2019-09-12 DIAGNOSIS — H2511 Age-related nuclear cataract, right eye: Secondary | ICD-10-CM | POA: Diagnosis not present

## 2019-09-12 DIAGNOSIS — I10 Essential (primary) hypertension: Secondary | ICD-10-CM | POA: Diagnosis not present

## 2019-09-18 ENCOUNTER — Encounter: Payer: Self-pay | Admitting: *Deleted

## 2019-09-18 ENCOUNTER — Other Ambulatory Visit: Payer: Self-pay

## 2019-09-21 ENCOUNTER — Other Ambulatory Visit: Payer: Self-pay

## 2019-09-21 ENCOUNTER — Other Ambulatory Visit
Admission: RE | Admit: 2019-09-21 | Discharge: 2019-09-21 | Disposition: A | Discharge status: Home / Self Care | Payer: PPO | Source: Ambulatory Visit | Attending: Ophthalmology | Admitting: Ophthalmology

## 2019-09-21 DIAGNOSIS — Z01812 Encounter for preprocedural laboratory examination: Secondary | ICD-10-CM | POA: Diagnosis not present

## 2019-09-21 DIAGNOSIS — Z20828 Contact with and (suspected) exposure to other viral communicable diseases: Secondary | ICD-10-CM | POA: Diagnosis not present

## 2019-09-21 LAB — SARS CORONAVIRUS 2 (TAT 6-24 HRS): SARS Coronavirus 2: NEGATIVE

## 2019-09-24 NOTE — Discharge Instructions (Signed)

## 2019-09-25 ENCOUNTER — Other Ambulatory Visit: Payer: Self-pay

## 2019-09-25 ENCOUNTER — Ambulatory Visit
Admission: RE | Admit: 2019-09-25 | Discharge: 2019-09-25 | Disposition: A | Discharge status: Home / Self Care | Payer: PPO | Attending: Ophthalmology | Admitting: Ophthalmology

## 2019-09-25 ENCOUNTER — Encounter
Admission: RE | Disposition: A | Discharge status: Home / Self Care | Payer: Self-pay | Source: Home / Self Care | Attending: Ophthalmology

## 2019-09-25 ENCOUNTER — Ambulatory Visit: Payer: PPO | Admitting: Anesthesiology

## 2019-09-25 DIAGNOSIS — H2511 Age-related nuclear cataract, right eye: Secondary | ICD-10-CM | POA: Insufficient documentation

## 2019-09-25 DIAGNOSIS — E7800 Pure hypercholesterolemia, unspecified: Secondary | ICD-10-CM | POA: Diagnosis not present

## 2019-09-25 DIAGNOSIS — Z86718 Personal history of other venous thrombosis and embolism: Secondary | ICD-10-CM | POA: Diagnosis not present

## 2019-09-25 DIAGNOSIS — Z79899 Other long term (current) drug therapy: Secondary | ICD-10-CM | POA: Diagnosis not present

## 2019-09-25 DIAGNOSIS — Z888 Allergy status to other drugs, medicaments and biological substances status: Secondary | ICD-10-CM | POA: Insufficient documentation

## 2019-09-25 DIAGNOSIS — Z96669 Presence of unspecified artificial ankle joint: Secondary | ICD-10-CM | POA: Insufficient documentation

## 2019-09-25 DIAGNOSIS — Z7982 Long term (current) use of aspirin: Secondary | ICD-10-CM | POA: Diagnosis not present

## 2019-09-25 DIAGNOSIS — I2510 Atherosclerotic heart disease of native coronary artery without angina pectoris: Secondary | ICD-10-CM | POA: Diagnosis not present

## 2019-09-25 DIAGNOSIS — Z794 Long term (current) use of insulin: Secondary | ICD-10-CM | POA: Insufficient documentation

## 2019-09-25 DIAGNOSIS — Z9842 Cataract extraction status, left eye: Secondary | ICD-10-CM | POA: Insufficient documentation

## 2019-09-25 DIAGNOSIS — H25811 Combined forms of age-related cataract, right eye: Secondary | ICD-10-CM | POA: Diagnosis not present

## 2019-09-25 DIAGNOSIS — I10 Essential (primary) hypertension: Secondary | ICD-10-CM | POA: Insufficient documentation

## 2019-09-25 DIAGNOSIS — Z87891 Personal history of nicotine dependence: Secondary | ICD-10-CM | POA: Diagnosis not present

## 2019-09-25 DIAGNOSIS — Z951 Presence of aortocoronary bypass graft: Secondary | ICD-10-CM | POA: Insufficient documentation

## 2019-09-25 DIAGNOSIS — M1990 Unspecified osteoarthritis, unspecified site: Secondary | ICD-10-CM | POA: Diagnosis not present

## 2019-09-25 DIAGNOSIS — E1136 Type 2 diabetes mellitus with diabetic cataract: Secondary | ICD-10-CM | POA: Diagnosis not present

## 2019-09-25 DIAGNOSIS — Z885 Allergy status to narcotic agent status: Secondary | ICD-10-CM | POA: Insufficient documentation

## 2019-09-25 DIAGNOSIS — Z952 Presence of prosthetic heart valve: Secondary | ICD-10-CM | POA: Diagnosis not present

## 2019-09-25 HISTORY — PX: CATARACT EXTRACTION W/PHACO: SHX586

## 2019-09-25 LAB — GLUCOSE, CAPILLARY
Glucose-Capillary: 86 mg/dL (ref 70–99)
Glucose-Capillary: 104 mg/dL — ABNORMAL HIGH (ref 70–99)

## 2019-09-25 SURGERY — PHACOEMULSIFICATION, CATARACT, WITH IOL INSERTION
Anesthesia: Monitor Anesthesia Care | Site: Eye | Laterality: Right

## 2019-09-25 MED ORDER — CARBACHOL 0.01 % IO SOLN
INTRAOCULAR | Status: DC | PRN
  Administered 2019-09-25: 0.5 mL via INTRAOCULAR

## 2019-09-25 MED ORDER — ARMC OPHTHALMIC DILATING DROPS
1.0000 "application " | OPHTHALMIC | Status: DC | PRN
  Administered 2019-09-25 (×3): 1 via OPHTHALMIC

## 2019-09-25 MED ORDER — MOXIFLOXACIN HCL 0.5 % OP SOLN
OPHTHALMIC | Status: DC | PRN
  Administered 2019-09-25: 0.2 mL via OPHTHALMIC

## 2019-09-25 MED ORDER — NA CHONDROIT SULF-NA HYALURON 40-17 MG/ML IO SOLN
INTRAOCULAR | Status: DC | PRN
  Administered 2019-09-25: 1 mL via INTRAOCULAR

## 2019-09-25 MED ORDER — LIDOCAINE HCL (PF) 2 % IJ SOLN
INTRAOCULAR | Status: DC | PRN
  Administered 2019-09-25: 1 mL

## 2019-09-25 MED ORDER — FENTANYL CITRATE (PF) 100 MCG/2ML IJ SOLN
INTRAMUSCULAR | Status: DC | PRN
  Administered 2019-09-25: 50 ug via INTRAVENOUS

## 2019-09-25 MED ORDER — TETRACAINE HCL 0.5 % OP SOLN
1.0000 [drp] | OPHTHALMIC | Status: DC | PRN
  Administered 2019-09-25 (×3): 1 [drp] via OPHTHALMIC

## 2019-09-25 MED ORDER — EPINEPHRINE PF 1 MG/ML IJ SOLN
INTRAOCULAR | Status: DC | PRN
  Administered 2019-09-25: 80 mL via OPHTHALMIC

## 2019-09-25 MED ORDER — MIDAZOLAM HCL 2 MG/2ML IJ SOLN
INTRAMUSCULAR | Status: DC | PRN
  Administered 2019-09-25: 1 mg via INTRAVENOUS

## 2019-09-25 SURGICAL SUPPLY — 20 items
CANNULA ANT/CHMB 27GA (MISCELLANEOUS) ×4 IMPLANT
GLOVE SURG LX 8.0 MICRO (GLOVE) ×2
GLOVE SURG LX STRL 8.0 MICRO (GLOVE) ×2 IMPLANT
GLOVE SURG TRIUMPH 8.0 PF LTX (GLOVE) ×2 IMPLANT
GOWN STRL REUS W/ TWL LRG LVL3 (GOWN DISPOSABLE) ×2 IMPLANT
GOWN STRL REUS W/TWL LRG LVL3 (GOWN DISPOSABLE) ×2
LENS IOL ACRYSOF IQ 22.0 (Intraocular Lens) ×2 IMPLANT
MARKER SKIN DUAL TIP RULER LAB (MISCELLANEOUS) ×2 IMPLANT
NDL RETROBULBAR .5 NSTRL (NEEDLE) ×2 IMPLANT
NEEDLE FILTER BLUNT 18X 1/2SAF (NEEDLE) ×1
NEEDLE FILTER BLUNT 18X1 1/2 (NEEDLE) ×1 IMPLANT
PACK EYE AFTER SURG (MISCELLANEOUS) ×2 IMPLANT
PACK OPTHALMIC (MISCELLANEOUS) ×2 IMPLANT
PACK PORFILIO (MISCELLANEOUS) ×2 IMPLANT
SUT ETHILON 10-0 CS-B-6CS-B-6 (SUTURE)
SUTURE EHLN 10-0 CS-B-6CS-B-6 (SUTURE) IMPLANT
SYR 3ML LL SCALE MARK (SYRINGE) ×2 IMPLANT
SYR TB 1ML LUER SLIP (SYRINGE) ×2 IMPLANT
WATER STERILE IRR 250ML POUR (IV SOLUTION) ×2 IMPLANT
WIPE NON LINTING 3.25X3.25 (MISCELLANEOUS) ×2 IMPLANT

## 2019-09-25 NOTE — Anesthesia Preprocedure Evaluation (Signed)
Anesthesia Evaluation  Patient identified by MRN, date of birth, ID band Patient awake    Reviewed: Allergy & Precautions, H&P , NPO status , Patient's Chart, lab work & pertinent test results, reviewed documented beta blocker date and time   Airway Mallampati: II  TM Distance: >3 FB Neck ROM: full    Dental no notable dental hx.    Pulmonary neg pulmonary ROS, former smoker,    Pulmonary exam normal breath sounds clear to auscultation       Cardiovascular Exercise Tolerance: Good hypertension, + CAD and + CABG  + Valvular Problems/Murmurs (s/p AVR 02/2019)  Rhythm:regular Rate:Normal     Neuro/Psych negative neurological ROS  negative psych ROS   GI/Hepatic negative GI ROS, Neg liver ROS,   Endo/Other  negative endocrine ROSdiabetes, Type 2, Insulin Dependent  Renal/GU negative Renal ROS  negative genitourinary   Musculoskeletal   Abdominal   Peds  Hematology negative hematology ROS (+)   Anesthesia Other Findings   Reproductive/Obstetrics negative OB ROS                             Anesthesia Physical Anesthesia Plan  ASA: III  Anesthesia Plan: MAC   Post-op Pain Management:    Induction:   PONV Risk Score and Plan: Treatment may vary due to age or medical condition  Airway Management Planned:   Additional Equipment:   Intra-op Plan:   Post-operative Plan:   Informed Consent: I have reviewed the patients History and Physical, chart, labs and discussed the procedure including the risks, benefits and alternatives for the proposed anesthesia with the patient or authorized representative who has indicated his/her understanding and acceptance.     Dental Advisory Given  Plan Discussed with: CRNA  Anesthesia Plan Comments:         Anesthesia Quick Evaluation

## 2019-09-25 NOTE — Op Note (Signed)
PREOPERATIVE DIAGNOSIS:  Nuclear sclerotic cataract of the right eye.   POSTOPERATIVE DIAGNOSIS:  Cataract   OPERATIVE PROCEDURE: Procedure(s): CATARACT EXTRACTION PHACO AND INTRAOCULAR LENS PLACEMENT (IOC) RIGHT DIABETIC   SURGEON:  Birder Robson, MD.   ANESTHESIA:  Anesthesiologist: Alisa Graff, MD CRNA: Georga Bora, CRNA  1.      Managed anesthesia care. 2.      0.75ml of Shugarcaine was instilled in the eye following the paracentesis.   COMPLICATIONS:  None.   TECHNIQUE:   Stop and chop   DESCRIPTION OF PROCEDURE:  The patient was examined and consented in the preoperative holding area where the aforementioned topical anesthesia was applied to the right eye and then brought back to the Operating Room where the right eye was prepped and draped in the usual sterile ophthalmic fashion and a lid speculum was placed. A paracentesis was created with the side port blade and the anterior chamber was filled with viscoelastic. A near clear corneal incision was performed with the steel keratome. A continuous curvilinear capsulorrhexis was performed with a cystotome followed by the capsulorrhexis forceps. Hydrodissection and hydrodelineation were carried out with BSS on a blunt cannula. The lens was removed in a stop and chop  technique and the remaining cortical material was removed with the irrigation-aspiration handpiece. The capsular bag was inflated with viscoelastic and the Technis [id number]  lens was placed in the capsular bag without complication. The remaining viscoelastic was removed from the eye with the irrigation-aspiration handpiece. The wounds were hydrated. The anterior chamber was flushed with Miostat and the eye was inflated to physiologic pressure. 0.64ml of Vigamox was placed in the anterior chamber. The wounds were found to be water tight. The eye was dressed with Vigamox. The patient was given protective glasses to wear throughout the day and a shield with which to sleep  tonight. The patient was also given drops with which to begin a drop regimen today and will follow-up with me in one day. Implant Name Type Inv. Item Serial No. Manufacturer Lot No. LRB No. Used Action  LENS IOL ACRYSOF IQ 22.0 - [id number] Intraocular Lens LENS IOL ACRYSOF IQ 22.0 [id number] ALCON  Right 1 Implanted   Procedure(s) with comments: CATARACT EXTRACTION PHACO AND INTRAOCULAR LENS PLACEMENT (IOC) RIGHT DIABETIC (Right) - 0:47 17.1% 8.05  Electronically signed: Melburn Leibfried 09/25/2019 8:26 AM

## 2019-09-25 NOTE — Transfer of Care (Signed)
Immediate Anesthesia Transfer of Care Note  Patient: Antonio Favor Sr.  Procedure(s) Performed: CATARACT EXTRACTION PHACO AND INTRAOCULAR LENS PLACEMENT (IOC) RIGHT DIABETIC (Right Eye)  Patient Location: PACU  Anesthesia Type: MAC  Level of Consciousness: awake, alert  and patient cooperative  Airway and Oxygen Therapy: Patient Spontanous Breathing and Patient connected to supplemental oxygen  Post-op Assessment: Post-op Vital signs reviewed, Patient's Cardiovascular Status Stable, Respiratory Function Stable, Patent Airway and No signs of Nausea or vomiting  Post-op Vital Signs: Reviewed and stable  Complications: No apparent anesthesia complications

## 2019-09-25 NOTE — Anesthesia Postprocedure Evaluation (Signed)
Anesthesia Post Note  Patient: Antonio Owczarzak Sr.  Procedure(s) Performed: CATARACT EXTRACTION PHACO AND INTRAOCULAR LENS PLACEMENT (IOC) RIGHT DIABETIC (Right Eye)     Patient location during evaluation: PACU Anesthesia Type: MAC Level of consciousness: awake and alert Pain management: pain level controlled Vital Signs Assessment: post-procedure vital signs reviewed and stable Respiratory status: spontaneous breathing, nonlabored ventilation, respiratory function stable and patient connected to nasal cannula oxygen Cardiovascular status: stable and blood pressure returned to baseline Postop Assessment: no apparent nausea or vomiting Anesthetic complications: no    Alisa Graff

## 2019-09-25 NOTE — H&P (Signed)
All labs reviewed. Abnormal studies sent to patients PCP when indicated.  Previous H&P reviewed, patient examined, there are NO CHANGES.  Antonio Porfilio11/3/20207:55 AM

## 2019-09-25 NOTE — Anesthesia Procedure Notes (Signed)
Procedure Name: MAC Date/Time: 09/25/2019 8:06 AM Performed by: Georga Bora, CRNA Pre-anesthesia Checklist: Patient identified, Emergency Drugs available, Suction available, Patient being monitored and Timeout performed Patient Re-evaluated:Patient Re-evaluated prior to induction Oxygen Delivery Method: Nasal cannula

## 2019-09-26 ENCOUNTER — Encounter: Payer: Self-pay | Admitting: Ophthalmology

## 2019-10-01 DIAGNOSIS — M9902 Segmental and somatic dysfunction of thoracic region: Secondary | ICD-10-CM | POA: Diagnosis not present

## 2019-10-01 DIAGNOSIS — M5134 Other intervertebral disc degeneration, thoracic region: Secondary | ICD-10-CM | POA: Diagnosis not present

## 2019-10-01 DIAGNOSIS — M9903 Segmental and somatic dysfunction of lumbar region: Secondary | ICD-10-CM | POA: Diagnosis not present

## 2019-10-01 DIAGNOSIS — M62830 Muscle spasm of back: Secondary | ICD-10-CM | POA: Diagnosis not present

## 2019-10-23 DIAGNOSIS — I350 Nonrheumatic aortic (valve) stenosis: Secondary | ICD-10-CM | POA: Diagnosis not present

## 2019-10-23 DIAGNOSIS — I340 Nonrheumatic mitral (valve) insufficiency: Secondary | ICD-10-CM | POA: Diagnosis not present

## 2019-10-23 DIAGNOSIS — I714 Abdominal aortic aneurysm, without rupture: Secondary | ICD-10-CM | POA: Diagnosis not present

## 2019-10-23 DIAGNOSIS — Z952 Presence of prosthetic heart valve: Secondary | ICD-10-CM | POA: Diagnosis not present

## 2019-10-23 DIAGNOSIS — E1159 Type 2 diabetes mellitus with other circulatory complications: Secondary | ICD-10-CM | POA: Diagnosis not present

## 2019-10-23 DIAGNOSIS — I10 Essential (primary) hypertension: Secondary | ICD-10-CM | POA: Diagnosis not present

## 2019-10-23 DIAGNOSIS — E782 Mixed hyperlipidemia: Secondary | ICD-10-CM | POA: Diagnosis not present

## 2019-10-23 DIAGNOSIS — I2510 Atherosclerotic heart disease of native coronary artery without angina pectoris: Secondary | ICD-10-CM | POA: Diagnosis not present

## 2019-10-29 DIAGNOSIS — M9903 Segmental and somatic dysfunction of lumbar region: Secondary | ICD-10-CM | POA: Diagnosis not present

## 2019-10-29 DIAGNOSIS — M9902 Segmental and somatic dysfunction of thoracic region: Secondary | ICD-10-CM | POA: Diagnosis not present

## 2019-10-29 DIAGNOSIS — M5134 Other intervertebral disc degeneration, thoracic region: Secondary | ICD-10-CM | POA: Diagnosis not present

## 2019-10-29 DIAGNOSIS — M62830 Muscle spasm of back: Secondary | ICD-10-CM | POA: Diagnosis not present

## 2019-12-03 DIAGNOSIS — M62830 Muscle spasm of back: Secondary | ICD-10-CM | POA: Diagnosis not present

## 2019-12-03 DIAGNOSIS — M9903 Segmental and somatic dysfunction of lumbar region: Secondary | ICD-10-CM | POA: Diagnosis not present

## 2019-12-03 DIAGNOSIS — M9902 Segmental and somatic dysfunction of thoracic region: Secondary | ICD-10-CM | POA: Diagnosis not present

## 2019-12-03 DIAGNOSIS — M5134 Other intervertebral disc degeneration, thoracic region: Secondary | ICD-10-CM | POA: Diagnosis not present

## 2019-12-31 DIAGNOSIS — M5134 Other intervertebral disc degeneration, thoracic region: Secondary | ICD-10-CM | POA: Diagnosis not present

## 2019-12-31 DIAGNOSIS — M9903 Segmental and somatic dysfunction of lumbar region: Secondary | ICD-10-CM | POA: Diagnosis not present

## 2019-12-31 DIAGNOSIS — M62830 Muscle spasm of back: Secondary | ICD-10-CM | POA: Diagnosis not present

## 2019-12-31 DIAGNOSIS — M9902 Segmental and somatic dysfunction of thoracic region: Secondary | ICD-10-CM | POA: Diagnosis not present

## 2020-01-08 DIAGNOSIS — Z952 Presence of prosthetic heart valve: Secondary | ICD-10-CM | POA: Diagnosis not present

## 2020-01-08 DIAGNOSIS — Z0000 Encounter for general adult medical examination without abnormal findings: Secondary | ICD-10-CM | POA: Diagnosis not present

## 2020-01-08 DIAGNOSIS — Z794 Long term (current) use of insulin: Secondary | ICD-10-CM | POA: Diagnosis not present

## 2020-01-08 DIAGNOSIS — I10 Essential (primary) hypertension: Secondary | ICD-10-CM | POA: Diagnosis not present

## 2020-01-08 DIAGNOSIS — E782 Mixed hyperlipidemia: Secondary | ICD-10-CM | POA: Diagnosis not present

## 2020-01-08 DIAGNOSIS — I714 Abdominal aortic aneurysm, without rupture: Secondary | ICD-10-CM | POA: Diagnosis not present

## 2020-01-08 DIAGNOSIS — E119 Type 2 diabetes mellitus without complications: Secondary | ICD-10-CM | POA: Diagnosis not present

## 2020-01-28 DIAGNOSIS — M62830 Muscle spasm of back: Secondary | ICD-10-CM | POA: Diagnosis not present

## 2020-01-28 DIAGNOSIS — M5134 Other intervertebral disc degeneration, thoracic region: Secondary | ICD-10-CM | POA: Diagnosis not present

## 2020-01-28 DIAGNOSIS — M9902 Segmental and somatic dysfunction of thoracic region: Secondary | ICD-10-CM | POA: Diagnosis not present

## 2020-01-28 DIAGNOSIS — M9903 Segmental and somatic dysfunction of lumbar region: Secondary | ICD-10-CM | POA: Diagnosis not present

## 2020-02-25 DIAGNOSIS — M9903 Segmental and somatic dysfunction of lumbar region: Secondary | ICD-10-CM | POA: Diagnosis not present

## 2020-02-25 DIAGNOSIS — M9902 Segmental and somatic dysfunction of thoracic region: Secondary | ICD-10-CM | POA: Diagnosis not present

## 2020-02-25 DIAGNOSIS — M5134 Other intervertebral disc degeneration, thoracic region: Secondary | ICD-10-CM | POA: Diagnosis not present

## 2020-02-25 DIAGNOSIS — M62830 Muscle spasm of back: Secondary | ICD-10-CM | POA: Diagnosis not present

## 2020-03-24 DIAGNOSIS — M9903 Segmental and somatic dysfunction of lumbar region: Secondary | ICD-10-CM | POA: Diagnosis not present

## 2020-03-24 DIAGNOSIS — M62830 Muscle spasm of back: Secondary | ICD-10-CM | POA: Diagnosis not present

## 2020-03-24 DIAGNOSIS — M9902 Segmental and somatic dysfunction of thoracic region: Secondary | ICD-10-CM | POA: Diagnosis not present

## 2020-03-24 DIAGNOSIS — M5134 Other intervertebral disc degeneration, thoracic region: Secondary | ICD-10-CM | POA: Diagnosis not present

## 2020-03-27 DIAGNOSIS — Z87891 Personal history of nicotine dependence: Secondary | ICD-10-CM | POA: Diagnosis not present

## 2020-03-27 DIAGNOSIS — I491 Atrial premature depolarization: Secondary | ICD-10-CM | POA: Diagnosis not present

## 2020-03-27 DIAGNOSIS — Z79899 Other long term (current) drug therapy: Secondary | ICD-10-CM | POA: Diagnosis not present

## 2020-03-27 DIAGNOSIS — I2510 Atherosclerotic heart disease of native coronary artery without angina pectoris: Secondary | ICD-10-CM | POA: Diagnosis not present

## 2020-03-27 DIAGNOSIS — Z48812 Encounter for surgical aftercare following surgery on the circulatory system: Secondary | ICD-10-CM | POA: Diagnosis not present

## 2020-03-27 DIAGNOSIS — I350 Nonrheumatic aortic (valve) stenosis: Secondary | ICD-10-CM | POA: Diagnosis not present

## 2020-03-27 DIAGNOSIS — Z951 Presence of aortocoronary bypass graft: Secondary | ICD-10-CM | POA: Diagnosis not present

## 2020-03-27 DIAGNOSIS — Z952 Presence of prosthetic heart valve: Secondary | ICD-10-CM | POA: Diagnosis not present

## 2020-04-14 DIAGNOSIS — M5134 Other intervertebral disc degeneration, thoracic region: Secondary | ICD-10-CM | POA: Diagnosis not present

## 2020-04-14 DIAGNOSIS — M62830 Muscle spasm of back: Secondary | ICD-10-CM | POA: Diagnosis not present

## 2020-04-14 DIAGNOSIS — M9902 Segmental and somatic dysfunction of thoracic region: Secondary | ICD-10-CM | POA: Diagnosis not present

## 2020-04-14 DIAGNOSIS — M9903 Segmental and somatic dysfunction of lumbar region: Secondary | ICD-10-CM | POA: Diagnosis not present

## 2020-04-22 DIAGNOSIS — I350 Nonrheumatic aortic (valve) stenosis: Secondary | ICD-10-CM | POA: Diagnosis not present

## 2020-04-22 DIAGNOSIS — I2510 Atherosclerotic heart disease of native coronary artery without angina pectoris: Secondary | ICD-10-CM | POA: Diagnosis not present

## 2020-04-22 DIAGNOSIS — E782 Mixed hyperlipidemia: Secondary | ICD-10-CM | POA: Diagnosis not present

## 2020-04-22 DIAGNOSIS — I10 Essential (primary) hypertension: Secondary | ICD-10-CM | POA: Diagnosis not present

## 2020-04-28 DIAGNOSIS — E113293 Type 2 diabetes mellitus with mild nonproliferative diabetic retinopathy without macular edema, bilateral: Secondary | ICD-10-CM | POA: Diagnosis not present

## 2020-05-06 DIAGNOSIS — M5134 Other intervertebral disc degeneration, thoracic region: Secondary | ICD-10-CM | POA: Diagnosis not present

## 2020-05-06 DIAGNOSIS — M9902 Segmental and somatic dysfunction of thoracic region: Secondary | ICD-10-CM | POA: Diagnosis not present

## 2020-05-06 DIAGNOSIS — M62830 Muscle spasm of back: Secondary | ICD-10-CM | POA: Diagnosis not present

## 2020-05-06 DIAGNOSIS — M9903 Segmental and somatic dysfunction of lumbar region: Secondary | ICD-10-CM | POA: Diagnosis not present

## 2020-05-26 DIAGNOSIS — M5134 Other intervertebral disc degeneration, thoracic region: Secondary | ICD-10-CM | POA: Diagnosis not present

## 2020-05-26 DIAGNOSIS — M9903 Segmental and somatic dysfunction of lumbar region: Secondary | ICD-10-CM | POA: Diagnosis not present

## 2020-05-26 DIAGNOSIS — M9902 Segmental and somatic dysfunction of thoracic region: Secondary | ICD-10-CM | POA: Diagnosis not present

## 2020-05-26 DIAGNOSIS — M62830 Muscle spasm of back: Secondary | ICD-10-CM | POA: Diagnosis not present

## 2020-07-01 DIAGNOSIS — M9902 Segmental and somatic dysfunction of thoracic region: Secondary | ICD-10-CM | POA: Diagnosis not present

## 2020-07-01 DIAGNOSIS — M5134 Other intervertebral disc degeneration, thoracic region: Secondary | ICD-10-CM | POA: Diagnosis not present

## 2020-07-01 DIAGNOSIS — M62830 Muscle spasm of back: Secondary | ICD-10-CM | POA: Diagnosis not present

## 2020-07-01 DIAGNOSIS — M9903 Segmental and somatic dysfunction of lumbar region: Secondary | ICD-10-CM | POA: Diagnosis not present

## 2020-07-08 DIAGNOSIS — E1159 Type 2 diabetes mellitus with other circulatory complications: Secondary | ICD-10-CM | POA: Diagnosis not present

## 2020-07-08 DIAGNOSIS — I714 Abdominal aortic aneurysm, without rupture: Secondary | ICD-10-CM | POA: Diagnosis not present

## 2020-07-08 DIAGNOSIS — M19012 Primary osteoarthritis, left shoulder: Secondary | ICD-10-CM | POA: Diagnosis not present

## 2020-07-08 DIAGNOSIS — E782 Mixed hyperlipidemia: Secondary | ICD-10-CM | POA: Diagnosis not present

## 2020-07-08 DIAGNOSIS — E119 Type 2 diabetes mellitus without complications: Secondary | ICD-10-CM | POA: Diagnosis not present

## 2020-07-08 DIAGNOSIS — M65331 Trigger finger, right middle finger: Secondary | ICD-10-CM | POA: Diagnosis not present

## 2020-07-08 DIAGNOSIS — M25512 Pain in left shoulder: Secondary | ICD-10-CM | POA: Diagnosis not present

## 2020-07-08 DIAGNOSIS — Z952 Presence of prosthetic heart valve: Secondary | ICD-10-CM | POA: Diagnosis not present

## 2020-07-08 DIAGNOSIS — Z794 Long term (current) use of insulin: Secondary | ICD-10-CM | POA: Diagnosis not present

## 2020-07-08 DIAGNOSIS — I10 Essential (primary) hypertension: Secondary | ICD-10-CM | POA: Diagnosis not present

## 2020-07-14 DIAGNOSIS — M7522 Bicipital tendinitis, left shoulder: Secondary | ICD-10-CM | POA: Diagnosis not present

## 2020-07-14 DIAGNOSIS — M65331 Trigger finger, right middle finger: Secondary | ICD-10-CM | POA: Diagnosis not present

## 2020-07-14 DIAGNOSIS — M7582 Other shoulder lesions, left shoulder: Secondary | ICD-10-CM | POA: Insufficient documentation

## 2020-07-29 DIAGNOSIS — M9903 Segmental and somatic dysfunction of lumbar region: Secondary | ICD-10-CM | POA: Diagnosis not present

## 2020-07-29 DIAGNOSIS — M62830 Muscle spasm of back: Secondary | ICD-10-CM | POA: Diagnosis not present

## 2020-07-29 DIAGNOSIS — M5134 Other intervertebral disc degeneration, thoracic region: Secondary | ICD-10-CM | POA: Diagnosis not present

## 2020-07-29 DIAGNOSIS — M9902 Segmental and somatic dysfunction of thoracic region: Secondary | ICD-10-CM | POA: Diagnosis not present

## 2020-08-07 DIAGNOSIS — Z23 Encounter for immunization: Secondary | ICD-10-CM | POA: Diagnosis not present

## 2020-08-26 DIAGNOSIS — M9903 Segmental and somatic dysfunction of lumbar region: Secondary | ICD-10-CM | POA: Diagnosis not present

## 2020-08-26 DIAGNOSIS — M62830 Muscle spasm of back: Secondary | ICD-10-CM | POA: Diagnosis not present

## 2020-08-26 DIAGNOSIS — M9902 Segmental and somatic dysfunction of thoracic region: Secondary | ICD-10-CM | POA: Diagnosis not present

## 2020-08-26 DIAGNOSIS — M5134 Other intervertebral disc degeneration, thoracic region: Secondary | ICD-10-CM | POA: Diagnosis not present

## 2020-09-01 DIAGNOSIS — M7522 Bicipital tendinitis, left shoulder: Secondary | ICD-10-CM | POA: Diagnosis not present

## 2020-09-01 DIAGNOSIS — M7582 Other shoulder lesions, left shoulder: Secondary | ICD-10-CM | POA: Diagnosis not present

## 2020-09-23 DIAGNOSIS — M9903 Segmental and somatic dysfunction of lumbar region: Secondary | ICD-10-CM | POA: Diagnosis not present

## 2020-09-23 DIAGNOSIS — M9905 Segmental and somatic dysfunction of pelvic region: Secondary | ICD-10-CM | POA: Diagnosis not present

## 2020-09-23 DIAGNOSIS — M5136 Other intervertebral disc degeneration, lumbar region: Secondary | ICD-10-CM | POA: Diagnosis not present

## 2020-09-23 DIAGNOSIS — M5134 Other intervertebral disc degeneration, thoracic region: Secondary | ICD-10-CM | POA: Diagnosis not present

## 2020-10-20 DIAGNOSIS — M5136 Other intervertebral disc degeneration, lumbar region: Secondary | ICD-10-CM | POA: Diagnosis not present

## 2020-10-20 DIAGNOSIS — M5134 Other intervertebral disc degeneration, thoracic region: Secondary | ICD-10-CM | POA: Diagnosis not present

## 2020-10-20 DIAGNOSIS — M9905 Segmental and somatic dysfunction of pelvic region: Secondary | ICD-10-CM | POA: Diagnosis not present

## 2020-10-20 DIAGNOSIS — M9903 Segmental and somatic dysfunction of lumbar region: Secondary | ICD-10-CM | POA: Diagnosis not present

## 2020-10-23 DIAGNOSIS — I10 Essential (primary) hypertension: Secondary | ICD-10-CM | POA: Diagnosis not present

## 2020-10-23 DIAGNOSIS — I6523 Occlusion and stenosis of bilateral carotid arteries: Secondary | ICD-10-CM | POA: Diagnosis not present

## 2020-10-23 DIAGNOSIS — I2510 Atherosclerotic heart disease of native coronary artery without angina pectoris: Secondary | ICD-10-CM | POA: Diagnosis not present

## 2020-10-23 DIAGNOSIS — Z952 Presence of prosthetic heart valve: Secondary | ICD-10-CM | POA: Diagnosis not present

## 2020-10-23 DIAGNOSIS — I350 Nonrheumatic aortic (valve) stenosis: Secondary | ICD-10-CM | POA: Diagnosis not present

## 2020-10-23 DIAGNOSIS — I714 Abdominal aortic aneurysm, without rupture: Secondary | ICD-10-CM | POA: Diagnosis not present

## 2020-10-23 DIAGNOSIS — I340 Nonrheumatic mitral (valve) insufficiency: Secondary | ICD-10-CM | POA: Diagnosis not present

## 2020-10-23 DIAGNOSIS — E782 Mixed hyperlipidemia: Secondary | ICD-10-CM | POA: Diagnosis not present

## 2020-10-24 ENCOUNTER — Ambulatory Visit: Payer: PPO

## 2020-10-27 DIAGNOSIS — H401122 Primary open-angle glaucoma, left eye, moderate stage: Secondary | ICD-10-CM | POA: Diagnosis not present

## 2020-11-17 DIAGNOSIS — M5134 Other intervertebral disc degeneration, thoracic region: Secondary | ICD-10-CM | POA: Diagnosis not present

## 2020-11-17 DIAGNOSIS — M5136 Other intervertebral disc degeneration, lumbar region: Secondary | ICD-10-CM | POA: Diagnosis not present

## 2020-11-17 DIAGNOSIS — M9903 Segmental and somatic dysfunction of lumbar region: Secondary | ICD-10-CM | POA: Diagnosis not present

## 2020-11-17 DIAGNOSIS — M9905 Segmental and somatic dysfunction of pelvic region: Secondary | ICD-10-CM | POA: Diagnosis not present

## 2020-12-10 DIAGNOSIS — I6523 Occlusion and stenosis of bilateral carotid arteries: Secondary | ICD-10-CM | POA: Diagnosis not present

## 2020-12-10 DIAGNOSIS — I714 Abdominal aortic aneurysm, without rupture: Secondary | ICD-10-CM | POA: Diagnosis not present

## 2020-12-15 DIAGNOSIS — M5134 Other intervertebral disc degeneration, thoracic region: Secondary | ICD-10-CM | POA: Diagnosis not present

## 2020-12-15 DIAGNOSIS — M5136 Other intervertebral disc degeneration, lumbar region: Secondary | ICD-10-CM | POA: Diagnosis not present

## 2020-12-15 DIAGNOSIS — M9903 Segmental and somatic dysfunction of lumbar region: Secondary | ICD-10-CM | POA: Diagnosis not present

## 2020-12-15 DIAGNOSIS — M9905 Segmental and somatic dysfunction of pelvic region: Secondary | ICD-10-CM | POA: Diagnosis not present

## 2020-12-16 DIAGNOSIS — I6523 Occlusion and stenosis of bilateral carotid arteries: Secondary | ICD-10-CM | POA: Diagnosis not present

## 2020-12-16 DIAGNOSIS — I10 Essential (primary) hypertension: Secondary | ICD-10-CM | POA: Diagnosis not present

## 2020-12-16 DIAGNOSIS — I350 Nonrheumatic aortic (valve) stenosis: Secondary | ICD-10-CM | POA: Diagnosis not present

## 2020-12-16 DIAGNOSIS — I2510 Atherosclerotic heart disease of native coronary artery without angina pectoris: Secondary | ICD-10-CM | POA: Diagnosis not present

## 2020-12-16 DIAGNOSIS — I714 Abdominal aortic aneurysm, without rupture: Secondary | ICD-10-CM | POA: Diagnosis not present

## 2021-01-12 DIAGNOSIS — M9903 Segmental and somatic dysfunction of lumbar region: Secondary | ICD-10-CM | POA: Diagnosis not present

## 2021-01-12 DIAGNOSIS — M9905 Segmental and somatic dysfunction of pelvic region: Secondary | ICD-10-CM | POA: Diagnosis not present

## 2021-01-12 DIAGNOSIS — M5136 Other intervertebral disc degeneration, lumbar region: Secondary | ICD-10-CM | POA: Diagnosis not present

## 2021-01-12 DIAGNOSIS — M5134 Other intervertebral disc degeneration, thoracic region: Secondary | ICD-10-CM | POA: Diagnosis not present

## 2021-01-13 DIAGNOSIS — Z794 Long term (current) use of insulin: Secondary | ICD-10-CM | POA: Diagnosis not present

## 2021-01-13 DIAGNOSIS — E782 Mixed hyperlipidemia: Secondary | ICD-10-CM | POA: Diagnosis not present

## 2021-01-13 DIAGNOSIS — Z0000 Encounter for general adult medical examination without abnormal findings: Secondary | ICD-10-CM | POA: Diagnosis not present

## 2021-01-13 DIAGNOSIS — E1159 Type 2 diabetes mellitus with other circulatory complications: Secondary | ICD-10-CM | POA: Diagnosis not present

## 2021-01-13 DIAGNOSIS — Z952 Presence of prosthetic heart valve: Secondary | ICD-10-CM | POA: Diagnosis not present

## 2021-01-13 DIAGNOSIS — I714 Abdominal aortic aneurysm, without rupture: Secondary | ICD-10-CM | POA: Diagnosis not present

## 2021-01-13 DIAGNOSIS — I10 Essential (primary) hypertension: Secondary | ICD-10-CM | POA: Diagnosis not present

## 2021-02-09 DIAGNOSIS — M9905 Segmental and somatic dysfunction of pelvic region: Secondary | ICD-10-CM | POA: Diagnosis not present

## 2021-02-09 DIAGNOSIS — M5134 Other intervertebral disc degeneration, thoracic region: Secondary | ICD-10-CM | POA: Diagnosis not present

## 2021-02-09 DIAGNOSIS — M5136 Other intervertebral disc degeneration, lumbar region: Secondary | ICD-10-CM | POA: Diagnosis not present

## 2021-02-09 DIAGNOSIS — M9903 Segmental and somatic dysfunction of lumbar region: Secondary | ICD-10-CM | POA: Diagnosis not present

## 2021-03-09 DIAGNOSIS — M9905 Segmental and somatic dysfunction of pelvic region: Secondary | ICD-10-CM | POA: Diagnosis not present

## 2021-03-09 DIAGNOSIS — M5134 Other intervertebral disc degeneration, thoracic region: Secondary | ICD-10-CM | POA: Diagnosis not present

## 2021-03-09 DIAGNOSIS — M9903 Segmental and somatic dysfunction of lumbar region: Secondary | ICD-10-CM | POA: Diagnosis not present

## 2021-03-09 DIAGNOSIS — M5136 Other intervertebral disc degeneration, lumbar region: Secondary | ICD-10-CM | POA: Diagnosis not present

## 2021-03-27 DIAGNOSIS — M1712 Unilateral primary osteoarthritis, left knee: Secondary | ICD-10-CM | POA: Insufficient documentation

## 2021-03-27 DIAGNOSIS — M1731 Unilateral post-traumatic osteoarthritis, right knee: Secondary | ICD-10-CM | POA: Diagnosis not present

## 2021-03-27 DIAGNOSIS — M170 Bilateral primary osteoarthritis of knee: Secondary | ICD-10-CM | POA: Diagnosis not present

## 2021-03-27 DIAGNOSIS — M65331 Trigger finger, right middle finger: Secondary | ICD-10-CM | POA: Diagnosis not present

## 2021-04-03 ENCOUNTER — Other Ambulatory Visit: Payer: Self-pay | Admitting: Surgery

## 2021-04-09 ENCOUNTER — Other Ambulatory Visit: Payer: Self-pay

## 2021-04-09 ENCOUNTER — Encounter
Admission: RE | Admit: 2021-04-09 | Discharge: 2021-04-09 | Disposition: A | Discharge status: Home / Self Care | Payer: HMO | Source: Ambulatory Visit | Attending: Surgery | Admitting: Surgery

## 2021-04-09 DIAGNOSIS — I10 Essential (primary) hypertension: Secondary | ICD-10-CM | POA: Diagnosis not present

## 2021-04-09 DIAGNOSIS — Z01810 Encounter for preprocedural cardiovascular examination: Secondary | ICD-10-CM | POA: Diagnosis not present

## 2021-04-09 HISTORY — DX: Dyspnea, unspecified: R06.00

## 2021-04-09 HISTORY — DX: Other intervertebral disc degeneration, thoracic region: M51.34

## 2021-04-09 HISTORY — DX: Depression, unspecified: F32.A

## 2021-04-09 HISTORY — DX: Occlusion and stenosis of bilateral carotid arteries: I65.23

## 2021-04-09 NOTE — Patient Instructions (Addendum)
INSTRUCTIONS FOR SURGERY     Your surgery is scheduled for:   Wednesday, MAY 25TH     To find out your arrival time for the day of surgery,          please call [phone] between 1 pm and 3 pm on :  Tuesday, MAY 24TH     When you arrive for surgery, report to the [facility]. ONCE THEY HAVE FINISHED THEIR PROCESS, PROCEED TO THE SECOND  FLOOR AND SIGN IN AT THE SURGERY DESK.    REMEMBER: Instructions that are not followed completely may result in serious medical risk,  up to and including death, or upon the discretion of your surgeon and anesthesiologist,            your surgery may need to be rescheduled.  __X__ 1. Do not eat food after midnight the night before your procedure.                    No gum, candy, lozenger, tic tacs, tums or hard candies.                  ABSOLUTELY NOTHING SOLID IN YOUR MOUTH AFTER MIDNIGHT                    You may drink unlimited clear liquids up to 2 hours before you are scheduled to arrive for surgery.                   Do not drink anything within those 2 hours unless you need to take medicine, then take the                   smallest amount you need.  Clear liquids include:  water, Black coffee, black tea.                   Sugar may be added but no dairy/ honey /lemon.                                    Broth and jello is not considered a clear liquid.  __x__  2. On the morning of surgery, please brush your teeth with toothpaste and water. You may rinse with                  mouthwash if you wish but DO NOT SWALLOW TOOTHPASTE OR MOUTHWASH  __X___3. NO alcohol for 24 hours before or after surgery.  __x___ 4.  Do NOT smoke or use e-cigarettes for 24 HOURS PRIOR TO SURGERY.                      DO NOT Use any chewable tobacco products for at least 6 hours prior to surgery.  __x___ 5. If you start any new medication after this appointment and  prior to surgery, please                   Bring it with you on the day of surgery.  ___x__ 6. Notify your doctor if there is any change in your medical condition, such as fever,                   infection, vomitting, diarrhea or any open sores.  __x___ 7.  USE the CHG SOAP as instructed, the night before surgery and the day of surgery.                  Once you have washed with this soap, do NOT use any of the following: Powders, perfumes                   or lotions. Please do not wear make up, hairpins, clips or nail polish. You MAY wear deodorant.                                                                Men may shave their face and neck.                    DO NOT wear ANY jewelry on the day of surgery. If there are rings that are too tight to                   remove easily, please address this prior to the surgery day. Piercings need to be removed.                                                                     NO METAL ON YOUR BODY.                   Do NOT bring any valuables.  If you came to Pre-Admit testing then you will not need license,                    insurance card or credit card.  If you will be staying overnight, please either leave your things in                    the car or have your family be responsible for these items.                    [facility] IS NOT RESPONSIBLE FOR BELONGINGS OR VALUABLES.  ___X__ 8. DO NOT wear contact lenses on surgery day.  You may not have dentures,                     Hearing aides, contacts or glasses in the operating room. These items can be                    Placed in the Recovery Room to receive immediately after surgery.  __x___ 9. IF YOU ARE SCHEDULED TO GO HOME ON THE SAME DAY, YOU MUST                   Have someone to drive you home and to stay with you  for  the first 24 hours.                    Have an arrangement prior to arriving on surgery day.  ___x__ 10. Take the following medications on the morning of surgery  with a sip of water:                              1.AMLODIPINE                     2.AZOPT EYE DROPS                     3.                    __X__  12. STOP ASPIRIN AND ALL ASPIRIN PRODUCTS TODAY, MAY 19TH                       THIS INCLUDES BC POWDERS / GOODIES POWDER  __x___ 13. STOP Anti-inflammatories as of TODAY, MAY 19TH                      This includes IBUPROFEN / MOTRIN / ADVIL / ALEVE/ NAPROXYN                    YOU MAY TAKE TYLENOL ANY TIME PRIOR TO SURGERY.  __X___ 14.  Stop supplements until after surgery.                     This includes: ALPHA LIPOIC ACID // CALCIUM-MAGNESIUM-ZINK //COQ10 //                        GARLIC // GINSENG // MULTIVITAMINS  __X____16.  TAKE YOUR INSULINS AS USUAL ON THE DAY PRIOR TO SURGERY.  DO NOT                        TAKE ANY INSULIN ON THE MORNING OF SURGERY.                                         Do NOT take any diabetes medications on surgery day.  ___X___17.  Continue to take the following medications but do not take on the morning of surgery:                          LISINOPRIL  ___X___18.Wear clean and comfortable clothing to the hospital.  BRING PHONE [place]. HAVE A LOOSE FITTING SLEEVE ON YOUR SHIRT.  GOOD LUCK!!

## 2021-04-10 ENCOUNTER — Other Ambulatory Visit
Admission: RE | Admit: 2021-04-10 | Discharge: 2021-04-10 | Disposition: A | Discharge status: Home / Self Care | Payer: HMO | Source: Ambulatory Visit | Attending: Surgery | Admitting: Surgery

## 2021-04-10 DIAGNOSIS — Z951 Presence of aortocoronary bypass graft: Secondary | ICD-10-CM | POA: Insufficient documentation

## 2021-04-10 DIAGNOSIS — I10 Essential (primary) hypertension: Secondary | ICD-10-CM | POA: Insufficient documentation

## 2021-04-10 DIAGNOSIS — Z01818 Encounter for other preprocedural examination: Secondary | ICD-10-CM | POA: Diagnosis not present

## 2021-04-10 DIAGNOSIS — I340 Nonrheumatic mitral (valve) insufficiency: Secondary | ICD-10-CM | POA: Insufficient documentation

## 2021-04-10 LAB — BASIC METABOLIC PANEL
Anion gap: 7 (ref 5–15)
BUN: 32 mg/dL — ABNORMAL HIGH (ref 8–23)
CO2: 25 mmol/L (ref 22–32)
Calcium: 9.3 mg/dL (ref 8.9–10.3)
Chloride: 105 mmol/L (ref 98–111)
Creatinine, Ser: 1.11 mg/dL (ref 0.61–1.24)
GFR, Estimated: >60 mL/min (ref >60)
Glucose, Bld: 125 mg/dL — ABNORMAL HIGH (ref 70–99)
Potassium: 3.9 mmol/L (ref 3.5–5.1)
Sodium: 137 mmol/L (ref 135–145)

## 2021-04-10 LAB — CBC
HCT: 37 % — ABNORMAL LOW (ref 39.0–52.0)
Hemoglobin: 12.5 g/dL — ABNORMAL LOW (ref 13.0–17.0)
MCH: 29.1 pg (ref 26.0–34.0)
MCHC: 33.8 g/dL (ref 30.0–36.0)
MCV: 86.2 fL (ref 80.0–100.0)
Platelets: 162 10*3/uL (ref 150–400)
RBC: 4.29 MIL/uL (ref 4.22–5.81)
RDW: 13.8 % (ref 11.5–15.5)
WBC: 9.6 10*3/uL (ref 4.0–10.5)
nRBC: 0 % (ref 0.0–0.2)

## 2021-04-13 ENCOUNTER — Encounter: Payer: Self-pay | Admitting: Surgery
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
## 2021-04-14 MED ORDER — CEFAZOLIN SODIUM-DEXTROSE 2-4 GM/100ML-% IV SOLN
2.0000 g | INTRAVENOUS | Status: AC
  Administered 2021-04-15: 2 g via INTRAVENOUS

## 2021-04-14 MED ORDER — SODIUM CHLORIDE 0.9 % IV SOLN: INTRAVENOUS | Status: DC

## 2021-04-14 MED ORDER — FAMOTIDINE 20 MG PO TABS: 20.0000 mg | ORAL_TABLET | Freq: Once | ORAL | Status: AC

## 2021-04-14 MED ORDER — ORAL CARE MOUTH RINSE: 15.0000 mL | Freq: Once | OROMUCOSAL | Status: AC

## 2021-04-14 MED ORDER — CHLORHEXIDINE GLUCONATE 0.12 % MT SOLN: 15.0000 mL | Freq: Once | OROMUCOSAL | Status: AC

## 2021-04-15 ENCOUNTER — Ambulatory Visit
Admission: RE | Admit: 2021-04-15 | Discharge: 2021-04-15 | Disposition: A | Discharge status: Home / Self Care | Payer: HMO | Attending: Surgery | Admitting: Surgery

## 2021-04-15 ENCOUNTER — Encounter
Admission: RE | Disposition: A | Discharge status: Home / Self Care | Payer: Self-pay | Source: Home / Self Care | Attending: Surgery

## 2021-04-15 ENCOUNTER — Other Ambulatory Visit: Payer: Self-pay

## 2021-04-15 ENCOUNTER — Ambulatory Visit: Payer: HMO | Admitting: Urgent Care

## 2021-04-15 ENCOUNTER — Encounter: Payer: Self-pay | Admitting: Surgery

## 2021-04-15 DIAGNOSIS — M65331 Trigger finger, right middle finger: Secondary | ICD-10-CM | POA: Diagnosis not present

## 2021-04-15 DIAGNOSIS — Z794 Long term (current) use of insulin: Secondary | ICD-10-CM | POA: Diagnosis not present

## 2021-04-15 DIAGNOSIS — Z951 Presence of aortocoronary bypass graft: Secondary | ICD-10-CM | POA: Insufficient documentation

## 2021-04-15 DIAGNOSIS — Z8249 Family history of ischemic heart disease and other diseases of the circulatory system: Secondary | ICD-10-CM | POA: Insufficient documentation

## 2021-04-15 DIAGNOSIS — Z801 Family history of malignant neoplasm of trachea, bronchus and lung: Secondary | ICD-10-CM | POA: Diagnosis not present

## 2021-04-15 DIAGNOSIS — E119 Type 2 diabetes mellitus without complications: Secondary | ICD-10-CM | POA: Insufficient documentation

## 2021-04-15 DIAGNOSIS — Z87891 Personal history of nicotine dependence: Secondary | ICD-10-CM | POA: Insufficient documentation

## 2021-04-15 DIAGNOSIS — Z888 Allergy status to other drugs, medicaments and biological substances status: Secondary | ICD-10-CM | POA: Diagnosis not present

## 2021-04-15 DIAGNOSIS — Z8379 Family history of other diseases of the digestive system: Secondary | ICD-10-CM | POA: Diagnosis not present

## 2021-04-15 DIAGNOSIS — K219 Gastro-esophageal reflux disease without esophagitis: Secondary | ICD-10-CM | POA: Diagnosis not present

## 2021-04-15 DIAGNOSIS — Z79899 Other long term (current) drug therapy: Secondary | ICD-10-CM | POA: Insufficient documentation

## 2021-04-15 DIAGNOSIS — Z833 Family history of diabetes mellitus: Secondary | ICD-10-CM | POA: Diagnosis not present

## 2021-04-15 DIAGNOSIS — Z885 Allergy status to narcotic agent status: Secondary | ICD-10-CM | POA: Insufficient documentation

## 2021-04-15 HISTORY — DX: Nonrheumatic aortic (valve) stenosis: I35.0

## 2021-04-15 HISTORY — PX: TRIGGER FINGER RELEASE: SHX641

## 2021-04-15 HISTORY — DX: Occlusion and stenosis of bilateral carotid arteries: I65.23

## 2021-04-15 LAB — GLUCOSE, CAPILLARY
Glucose-Capillary: 111 mg/dL — ABNORMAL HIGH (ref 70–99)
Glucose-Capillary: 104 mg/dL — ABNORMAL HIGH (ref 70–99)

## 2021-04-15 SURGERY — RELEASE, A1 PULLEY, FOR TRIGGER FINGER
Anesthesia: General | Site: Middle Finger | Laterality: Right

## 2021-04-15 MED ORDER — ONDANSETRON HCL 4 MG/2ML IJ SOLN
INTRAMUSCULAR | Status: DC | PRN
  Administered 2021-04-15: 4 mg via INTRAVENOUS

## 2021-04-15 MED ORDER — PHENYLEPHRINE HCL (PRESSORS) 10 MG/ML IV SOLN
INTRAVENOUS | Status: DC | PRN
  Administered 2021-04-15: 100 ug via INTRAVENOUS

## 2021-04-15 MED ORDER — FENTANYL CITRATE (PF) 250 MCG/5ML IJ SOLN
INTRAMUSCULAR | Status: AC
  Filled 2021-04-15: qty 5

## 2021-04-15 MED ORDER — GLYCOPYRROLATE 0.2 MG/ML IJ SOLN
INTRAMUSCULAR | Status: AC
  Filled 2021-04-15: qty 1

## 2021-04-15 MED ORDER — DEXAMETHASONE SODIUM PHOSPHATE 10 MG/ML IJ SOLN
INTRAMUSCULAR | Status: AC
  Filled 2021-04-15: qty 1

## 2021-04-15 MED ORDER — SEVOFLURANE IN SOLN
RESPIRATORY_TRACT | Status: AC
  Filled 2021-04-15: qty 250

## 2021-04-15 MED ORDER — DEXAMETHASONE SODIUM PHOSPHATE 10 MG/ML IJ SOLN
INTRAMUSCULAR | Status: DC | PRN
  Administered 2021-04-15: 8 mg via INTRAVENOUS

## 2021-04-15 MED ORDER — GLYCOPYRROLATE 0.2 MG/ML IJ SOLN
INTRAMUSCULAR | Status: DC | PRN
  Administered 2021-04-15: .2 mg via INTRAVENOUS

## 2021-04-15 MED ORDER — PROPOFOL 10 MG/ML IV BOLUS
INTRAVENOUS | Status: DC | PRN
  Administered 2021-04-15: 120 mg via INTRAVENOUS

## 2021-04-15 MED ORDER — FENTANYL CITRATE (PF) 100 MCG/2ML IJ SOLN
INTRAMUSCULAR | Status: AC
  Filled 2021-04-15: qty 2

## 2021-04-15 MED ORDER — CEFAZOLIN SODIUM-DEXTROSE 2-4 GM/100ML-% IV SOLN
INTRAVENOUS | Status: AC
  Filled 2021-04-15: qty 100

## 2021-04-15 MED ORDER — ACETAMINOPHEN 10 MG/ML IV SOLN
INTRAVENOUS | Status: AC
  Filled 2021-04-15: qty 100

## 2021-04-15 MED ORDER — ONDANSETRON HCL 4 MG/2ML IJ SOLN: 4.0000 mg | Freq: Once | INTRAMUSCULAR | Status: DC | PRN

## 2021-04-15 MED ORDER — CHLORHEXIDINE GLUCONATE 0.12 % MT SOLN
OROMUCOSAL | Status: AC
  Administered 2021-04-15: 15 mL via OROMUCOSAL
  Filled 2021-04-15: qty 15

## 2021-04-15 MED ORDER — ACETAMINOPHEN 10 MG/ML IV SOLN: 1000.0000 mg | Freq: Once | INTRAVENOUS | Status: DC | PRN

## 2021-04-15 MED ORDER — TRAMADOL HCL 50 MG PO TABS: 50.0000 mg | ORAL_TABLET | Freq: Four times a day (QID) | ORAL | 0 refills | Status: DC | PRN

## 2021-04-15 MED ORDER — BUPIVACAINE HCL (PF) 0.5 % IJ SOLN
INTRAMUSCULAR | Status: AC
  Filled 2021-04-15: qty 30

## 2021-04-15 MED ORDER — FAMOTIDINE 20 MG PO TABS
ORAL_TABLET | ORAL | Status: AC
  Administered 2021-04-15: 20 mg via ORAL
  Filled 2021-04-15: qty 1

## 2021-04-15 MED ORDER — FENTANYL CITRATE (PF) 100 MCG/2ML IJ SOLN
INTRAMUSCULAR | Status: DC | PRN
  Administered 2021-04-15: 50 ug via INTRAVENOUS

## 2021-04-15 MED ORDER — PROPOFOL 10 MG/ML IV BOLUS
INTRAVENOUS | Status: AC
  Filled 2021-04-15: qty 20

## 2021-04-15 MED ORDER — ONDANSETRON HCL 4 MG/2ML IJ SOLN
INTRAMUSCULAR | Status: AC
  Filled 2021-04-15: qty 2

## 2021-04-15 MED ORDER — FENTANYL CITRATE (PF) 100 MCG/2ML IJ SOLN: 25.0000 ug | INTRAMUSCULAR | Status: DC | PRN

## 2021-04-15 MED ORDER — LIDOCAINE HCL (CARDIAC) PF 100 MG/5ML IV SOSY
PREFILLED_SYRINGE | INTRAVENOUS | Status: DC | PRN
  Administered 2021-04-15: 100 mg via INTRAVENOUS

## 2021-04-15 MED ORDER — ACETAMINOPHEN 10 MG/ML IV SOLN
INTRAVENOUS | Status: DC | PRN
  Administered 2021-04-15: 1000 mg via INTRAVENOUS

## 2021-04-15 SURGICAL SUPPLY — 25 items
APL PRP STRL LF DISP 70% ISPRP (MISCELLANEOUS) ×1
BNDG CMPR STD VLCR NS LF 5.8X2 (GAUZE/BANDAGES/DRESSINGS) ×1
BNDG ELASTIC 2X5.8 VLCR NS LF (GAUZE/BANDAGES/DRESSINGS) ×2 IMPLANT
BNDG ESMARK 4X12 TAN STRL LF (GAUZE/BANDAGES/DRESSINGS) ×2 IMPLANT
CHLORAPREP W/TINT 26 (MISCELLANEOUS) ×2 IMPLANT
CORD BIP STRL DISP 12FT (MISCELLANEOUS) ×2 IMPLANT
COVER WAND RF STERILE (DRAPES) ×2 IMPLANT
CUFF TOURN SGL QUICK 18X4 (TOURNIQUET CUFF) ×2 IMPLANT
DRAPE SURG 17X11 SM STRL (DRAPES) ×4 IMPLANT
FORCEPS JEWEL BIP 4-3/4 STR (INSTRUMENTS) ×2 IMPLANT
GAUZE SPONGE 4X4 12PLY STRL (GAUZE/BANDAGES/DRESSINGS) ×2 IMPLANT
GAUZE XEROFORM 1X8 LF (GAUZE/BANDAGES/DRESSINGS) ×2 IMPLANT
GLOVE SURG ENC MOIS LTX SZ8 (GLOVE) ×4 IMPLANT
GLOVE SURG UNDER LTX SZ8 (GLOVE) ×2 IMPLANT
GOWN STRL REUS W/ TWL LRG LVL3 (GOWN DISPOSABLE) ×1 IMPLANT
GOWN STRL REUS W/ TWL XL LVL3 (GOWN DISPOSABLE) ×1 IMPLANT
GOWN STRL REUS W/TWL LRG LVL3 (GOWN DISPOSABLE) ×2
GOWN STRL REUS W/TWL XL LVL3 (GOWN DISPOSABLE) ×2
KIT TURNOVER KIT A (KITS) ×2 IMPLANT
MANIFOLD NEPTUNE II (INSTRUMENTS) ×2 IMPLANT
NEEDLE HYPO 25X1 1.5 SAFETY (NEEDLE) ×2 IMPLANT
NS IRRIG 500ML POUR BTL (IV SOLUTION) ×2 IMPLANT
PACK EXTREMITY ARMC (MISCELLANEOUS) ×2 IMPLANT
STOCKINETTE IMPERVIOUS 9X36 MD (GAUZE/BANDAGES/DRESSINGS) ×2 IMPLANT
SUT PROLENE 4 0 PS 2 18 (SUTURE) ×2 IMPLANT

## 2021-04-15 NOTE — Anesthesia Procedure Notes (Signed)
Procedure Name: LMA Insertion Date/Time: 04/15/2021 11:27 AM Performed by: Johnna Acosta, CRNA Pre-anesthesia Checklist: Patient identified, Emergency Drugs available, Suction available, Patient being monitored and Timeout performed Patient Re-evaluated:Patient Re-evaluated prior to induction Oxygen Delivery Method: Circle system utilized Preoxygenation: Pre-oxygenation with 100% oxygen Induction Type: IV induction LMA: LMA inserted LMA Size: 3.5 Tube type: Oral Number of attempts: 1 Placement Confirmation: positive ETCO2 and breath sounds checked- equal and bilateral Tube secured with: Tape Dental Injury: Teeth and Oropharynx as per pre-operative assessment
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
## 2021-04-15 NOTE — Discharge Instructions (Addendum)
Orthopedic discharge instructions: Keep dressing dry and intact. Keep hand elevated above heart level. May shower after dressing removed on postop day 4 (Sunday). Cover sutures with Band-Aids after drying off. Apply ice to affected area frequently. Take Aleve 1-2 tabs BID OR ibuprofen 600-800 mg TID with meals for 7-10 days, then as necessary. Take ES Tylenol or pain medication as prescribed when needed.  Return for follow-up in 10-14 days or as scheduled.    AMBULATORY SURGERY  DISCHARGE INSTRUCTIONS   1) The drugs that you were given will stay in your system until tomorrow so for the next 24 hours you should not:  A) Drive an automobile B) Make any legal decisions C) Drink any alcoholic beverage   2) You may resume regular meals tomorrow.  Today it is better to start with liquids and gradually work up to solid foods.  You may eat anything you prefer, but it is better to start with liquids, then soup and crackers, and gradually work up to solid foods.   3) Please notify your doctor immediately if you have any unusual bleeding, trouble breathing, redness and pain at the surgery site, drainage, fever, or pain not relieved by medication.    4) Additional Instructions:        Please contact your physician with any problems or Same Day Surgery at [phone], Monday through Friday 6 am to 4 pm, or  at [facility] number at [phone].

## 2021-04-15 NOTE — Op Note (Signed)
04/15/2021  12:02 PM  Patient:   Antonio Rathert Sr.  Pre-Op Diagnosis:   Right long trigger finger.  Post-Op Diagnosis:   Same  Procedure:   Release right long trigger finger.  Surgeon:   Pascal Lux, MD  Assistant:   None  Anesthesia:   General LMA  Findings:   As above.  Complications:   None  EBL:   0 cc  Fluids:   500 cc crystalloid  TT:   12 minutes at 250 mmHg  Drains:   None  Closure:   4-0 Prolene  Implants:   None  Brief Clinical Note:   The patient is an 80 year old male with a long history of progressively worsening painful catching of his right long finger. These symptoms have progressed despite medications, activity modification, etc. The patient's history and examination were consistent with a right long trigger finger. The patient presents at this time for a right long trigger finger release.  Procedure:   The patient was brought into the operating room and lain in the supine position. After adequate general laryngeal mask anesthesia was achieved, the right hand and upper extremity were prepped with ChloraPrep solution before being draped sterilely. Preoperative antibiotics were administered. A timeout was performed to verify the appropriate surgical site before the limb was exsanguinated with an Esmarch and the tourniquet inflated to 250 mmHg.  An approximately 1.5-2.0 cm incision was made over the volar aspect of the right long finger at the level of the metacarpal head centered over the flexor sheath. The incision was carried down through the subcutaneous tissues with care taken to identify and protect the digital neurovascular structures. The flexor sheath was entered just proximal to the A1 pulley. The sheath was released proximally for several centimeters under direct visualization. Distally, a clamp was placed beneath the A1 pulley and used to release any adhesions. The clamp was repositioned so that one jaw was superficial to and the other jaw  deep to the A1 pulley. The A1 pulley was incised on either side of the clamp to remove a 2 mm strip of tissue. Metzenbaum scissors were used to ensure complete release of the A1 pulley more distally. The underlying tendons were carefully inspected and found to be intact.   The wound was copiously irrigated with sterile saline solution before the wound was closed using 4-0 Prolene interrupted sutures. A total of 10 cc of 0.5% plain Sensorcaine was injected in and around the incision to help with postoperative analgesia before a sterile bulky dressing was applied to the hand. The patient was then awakened, extubated, and returned to the recovery room in satisfactory condition after tolerating the procedure well.

## 2021-04-15 NOTE — Anesthesia Postprocedure Evaluation (Signed)
Anesthesia Post Note  Patient: Antonio Montanari Sr.  Procedure(s) Performed: RELEASE TRIGGER FINGER/A-1 PULLEY RIGHT LONG FINGER (Right Middle Finger)  Patient location during evaluation: PACU Anesthesia Type: General Level of consciousness: awake and alert Pain management: pain level controlled Vital Signs Assessment: post-procedure vital signs reviewed and stable Respiratory status: spontaneous breathing, nonlabored ventilation, respiratory function stable and patient connected to nasal cannula oxygen Cardiovascular status: blood pressure returned to baseline and stable Postop Assessment: no apparent nausea or vomiting Anesthetic complications: no   No complications documented.   Last Vitals:  Vitals:   04/15/21 1239 04/15/21 1250  BP:  (!) 182/70  Pulse: 81   Resp: 17   Temp: (!) 36.1 C   SpO2: 97%     Last Pain:  Vitals:   04/15/21 1239  TempSrc: Temporal  PainSc: 0-No pain                 Arita Miss
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
## 2021-04-15 NOTE — Transfer of Care (Signed)
Immediate Anesthesia Transfer of Care Note  Patient: Antonio Favor Sr.  Procedure(s) Performed: RELEASE TRIGGER FINGER/A-1 PULLEY RIGHT LONG FINGER (Right Middle Finger)  Patient Location: PACU  Anesthesia Type:General  Level of Consciousness: awake, alert  and oriented  Airway & Oxygen Therapy: Patient Spontanous Breathing and Patient connected to face mask oxygen  Post-op Assessment: Report given to RN and Post -op Vital signs reviewed and stable  Post vital signs: Reviewed and stable  Last Vitals:  Vitals Value Taken Time  BP 155/79 04/15/21 1215  Temp    Pulse 80 04/15/21 1217  Resp 17 04/15/21 1217  SpO2 99 % 04/15/21 1217  Vitals shown include unvalidated device data.  Last Pain:  Vitals:   04/15/21 1010  TempSrc: Temporal  PainSc: 0-No pain         Complications: No complications documented.

## 2021-04-27 DIAGNOSIS — H401122 Primary open-angle glaucoma, left eye, moderate stage: Secondary | ICD-10-CM | POA: Diagnosis not present

## 2021-05-07 ENCOUNTER — Other Ambulatory Visit: Payer: Self-pay | Admitting: Surgery

## 2021-05-20 ENCOUNTER — Other Ambulatory Visit: Payer: Self-pay | Admitting: Pain Medicine

## 2021-05-20 ENCOUNTER — Ambulatory Visit (HOSPITAL_BASED_OUTPATIENT_CLINIC_OR_DEPARTMENT_OTHER): Payer: HMO | Admitting: Pain Medicine

## 2021-05-20 ENCOUNTER — Ambulatory Visit
Admission: RE | Admit: 2021-05-20 | Discharge: 2021-05-20 | Disposition: A | Discharge status: Home / Self Care | Payer: HMO | Source: Ambulatory Visit | Attending: Pain Medicine | Admitting: Pain Medicine

## 2021-05-20 ENCOUNTER — Other Ambulatory Visit
Admission: RE | Admit: 2021-05-20 | Discharge: 2021-05-20 | Disposition: A | Discharge status: Home / Self Care | Payer: HMO | Source: Home / Self Care | Attending: Pain Medicine | Admitting: Pain Medicine

## 2021-05-20 ENCOUNTER — Other Ambulatory Visit: Payer: Self-pay

## 2021-05-20 ENCOUNTER — Encounter: Payer: Self-pay | Admitting: Pain Medicine

## 2021-05-20 ENCOUNTER — Ambulatory Visit
Admission: RE | Admit: 2021-05-20 | Discharge: 2021-05-20 | Disposition: A | Discharge status: Home / Self Care | Payer: HMO | Attending: Pain Medicine | Admitting: Pain Medicine

## 2021-05-20 VITALS — BP 146/61 | HR 67 | Temp 97.2°F | Resp 16 | Ht 70.0 in | Wt 191.0 lb

## 2021-05-20 DIAGNOSIS — M4310 Spondylolisthesis, site unspecified: Secondary | ICD-10-CM

## 2021-05-20 DIAGNOSIS — M533 Sacrococcygeal disorders, not elsewhere classified: Secondary | ICD-10-CM | POA: Diagnosis not present

## 2021-05-20 DIAGNOSIS — I739 Peripheral vascular disease, unspecified: Secondary | ICD-10-CM | POA: Diagnosis not present

## 2021-05-20 DIAGNOSIS — M25552 Pain in left hip: Secondary | ICD-10-CM | POA: Insufficient documentation

## 2021-05-20 DIAGNOSIS — M5126 Other intervertebral disc displacement, lumbar region: Secondary | ICD-10-CM | POA: Insufficient documentation

## 2021-05-20 DIAGNOSIS — M545 Low back pain, unspecified: Secondary | ICD-10-CM

## 2021-05-20 DIAGNOSIS — M5137 Other intervertebral disc degeneration, lumbosacral region: Secondary | ICD-10-CM

## 2021-05-20 DIAGNOSIS — F32A Depression, unspecified: Secondary | ICD-10-CM | POA: Insufficient documentation

## 2021-05-20 DIAGNOSIS — R937 Abnormal findings on diagnostic imaging of other parts of musculoskeletal system: Secondary | ICD-10-CM | POA: Insufficient documentation

## 2021-05-20 DIAGNOSIS — G8929 Other chronic pain: Secondary | ICD-10-CM | POA: Insufficient documentation

## 2021-05-20 DIAGNOSIS — I700 Atherosclerosis of aorta: Secondary | ICD-10-CM | POA: Diagnosis not present

## 2021-05-20 DIAGNOSIS — M4316 Spondylolisthesis, lumbar region: Secondary | ICD-10-CM | POA: Diagnosis not present

## 2021-05-20 DIAGNOSIS — Z79891 Long term (current) use of opiate analgesic: Secondary | ICD-10-CM | POA: Diagnosis not present

## 2021-05-20 DIAGNOSIS — M47816 Spondylosis without myelopathy or radiculopathy, lumbar region: Secondary | ICD-10-CM

## 2021-05-20 DIAGNOSIS — M1731 Unilateral post-traumatic osteoarthritis, right knee: Secondary | ICD-10-CM | POA: Insufficient documentation

## 2021-05-20 DIAGNOSIS — Z789 Other specified health status: Secondary | ICD-10-CM | POA: Insufficient documentation

## 2021-05-20 DIAGNOSIS — M899 Disorder of bone, unspecified: Secondary | ICD-10-CM | POA: Diagnosis not present

## 2021-05-20 DIAGNOSIS — M48061 Spinal stenosis, lumbar region without neurogenic claudication: Secondary | ICD-10-CM | POA: Insufficient documentation

## 2021-05-20 DIAGNOSIS — N400 Enlarged prostate without lower urinary tract symptoms: Secondary | ICD-10-CM | POA: Insufficient documentation

## 2021-05-20 DIAGNOSIS — M8588 Other specified disorders of bone density and structure, other site: Secondary | ICD-10-CM | POA: Diagnosis not present

## 2021-05-20 DIAGNOSIS — Z79899 Other long term (current) drug therapy: Secondary | ICD-10-CM

## 2021-05-20 DIAGNOSIS — M1612 Unilateral primary osteoarthritis, left hip: Secondary | ICD-10-CM | POA: Diagnosis not present

## 2021-05-20 DIAGNOSIS — M47817 Spondylosis without myelopathy or radiculopathy, lumbosacral region: Secondary | ICD-10-CM | POA: Diagnosis not present

## 2021-05-20 DIAGNOSIS — G894 Chronic pain syndrome: Secondary | ICD-10-CM

## 2021-05-20 LAB — COMPREHENSIVE METABOLIC PANEL
ALT: 18 U/L (ref 0–44)
AST: 24 U/L (ref 15–41)
Albumin: 4.2 g/dL (ref 3.5–5.0)
Alkaline Phosphatase: 68 U/L (ref 38–126)
Anion gap: 7 (ref 5–15)
BUN: 23 mg/dL (ref 8–23)
CO2: 26 mmol/L (ref 22–32)
Calcium: 9.4 mg/dL (ref 8.9–10.3)
Chloride: 104 mmol/L (ref 98–111)
Creatinine, Ser: 1.06 mg/dL (ref 0.61–1.24)
GFR, Estimated: >60 mL/min (ref >60)
Glucose, Bld: 114 mg/dL — ABNORMAL HIGH (ref 70–99)
Potassium: 4.2 mmol/L (ref 3.5–5.1)
Sodium: 137 mmol/L (ref 135–145)
Total Bilirubin: 1.6 mg/dL — ABNORMAL HIGH (ref 0.3–1.2)
Total Protein: 7.6 g/dL (ref 6.5–8.1)

## 2021-05-20 LAB — URINE DRUG SCREEN, QUALITATIVE (ARMC ONLY)
Amphetamines, Ur Screen: NOT DETECTED
Barbiturates, Ur Screen: NOT DETECTED
Benzodiazepine, Ur Scrn: NOT DETECTED
Cannabinoid 50 Ng, Ur ~~LOC~~: NOT DETECTED
Cocaine Metabolite,Ur ~~LOC~~: NOT DETECTED
MDMA (Ecstasy)Ur Screen: NOT DETECTED
Methadone Scn, Ur: NOT DETECTED
Opiate, Ur Screen: NOT DETECTED
Phencyclidine (PCP) Ur S: NOT DETECTED
Tricyclic, Ur Screen: NOT DETECTED

## 2021-05-20 LAB — SEDIMENTATION RATE: Sed Rate: 8 mm/hr (ref 0–20)

## 2021-05-20 LAB — VITAMIN B12: Vitamin B-12: 234 pg/mL (ref 180–914)

## 2021-05-20 LAB — MAGNESIUM: Magnesium: 2.1 mg/dL (ref 1.7–2.4)

## 2021-05-20 LAB — C-REACTIVE PROTEIN: CRP: 0.5 mg/dL (ref <1.0)

## 2021-05-20 NOTE — Progress Notes (Addendum)
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  Comp. Metabolic Panel (12)  Magnesium  Vitamin B12  Sedimentation rate  25-Hydroxy vitamin D Lcms D2+D3  C-reactive protein   Imaging Orders  DG Lumbar Spine Complete W/Bend  DG HIP UNILAT W OR W/O PELVIS 2-3 VIEWS LEFT  DG Si Joints   Referral Orders  No referral(s) requested today   Procedure Orders  LUMBAR FACET(MEDIAL BRANCH NERVE BLOCK) MBNB   Pharmacotherapy (current): Medications ordered:  No orders of the defined types were placed in this encounter.  Medications administered during this visit: Antonio Edward Sr. "Bill" had no medications administered during this visit.   Pharmacological management options:  Opioid Analgesics: The patient was informed that there is no guarantee that he would be a candidate for opioid analgesics. The decision will be made following CDC guidelines. This decision will be based on the results of diagnostic studies, as well as Antonio Villarreal's risk profile.   Membrane stabilizer: To  be determined at a later time  Muscle relaxant: To be determined at a later time  NSAID: To be determined  at a later time  Other analgesic(s): To be determined at a later time   Interventional management options: Antonio Villarreal was informed that there is no guarantee that he would be a candidate for interventional therapies. The decision will be based on the results of diagnostic studies, as well as Antonio Villarreal's risk profile.  Procedure(s) under consideration:  Pending results of ordered studies    Interventional Therapies  Risk  Complexity Considerations:   Estimated body mass index is 27.41 kg/m as calculated from the following:   Height as of this encounter: '5\' 10"'  (1.778 m).   Weight as of this encounter: 191 lb (86.6 kg). WNL   Planned  Pending:   Pending further evaluation   Under consideration:   Diagnostic bilateral lumbar facet block #1    Completed:   Therapeutic right L4-5 LESI x3 (11/06/2013 through 02/28/2014)  Therapeutic right IA steroid knee injection x1 (06/17/2014)    Therapeutic  Palliative (PRN) options:   None established   Provider-requested follow-up: Return for Procedure (no sedation): (B) L-FCT Blk #1.  Future Appointments  Date Time Provider [place]  05/22/2021 10:00 AM [facility] PAT3 [facility] None  05/29/2021  8:15 AM [facility] None  06/09/2021  8:40 AM Milinda Pointer, MD [facility] None    Note by: Gaspar Cola, MD Date: 05/20/2021; Time: 10:42 AM

## 2021-05-20 NOTE — Patient Instructions (Addendum)
____________________________________________________________________________________________  Preparing for your procedure (without sedation)  Procedure appointments are limited to planned procedures: No Prescription Refills. No disability issues will be discussed. No medication changes will be discussed.  Instructions: Oral Intake: Do not eat or drink anything for at least 6 hours prior to your procedure. (Exception: Blood Pressure Medication. See below.) Transportation: Unless otherwise stated by your physician, you may drive yourself after the procedure. Blood Pressure Medicine: Do not forget to take your blood pressure medicine with a sip of water the morning of the procedure. If your Diastolic (lower reading)is above 100 mmHg, elective cases will be cancelled/rescheduled. Blood thinners: These will need to be stopped for procedures. Notify our staff if you are taking any blood thinners. Depending on which one you take, there will be specific instructions on how and when to stop it. Diabetics on insulin: Notify the staff so that you can be scheduled 1st case in the morning. If your diabetes requires high dose insulin, take only  of your normal insulin dose the morning of the procedure and notify the staff that you have done so. Preventing infections: Shower with an antibacterial soap the morning of your procedure.  Build-up your immune system: Take 1000 mg of Vitamin C with every meal (3 times a day) the day prior to your procedure. Antibiotics: Inform the staff if you have a condition or reason that requires you to take antibiotics before dental procedures. Pregnancy: If you are pregnant, call and cancel the procedure. Sickness: If you have a cold, fever, or any active infections, call and cancel the procedure. Arrival: You must be in the facility at least 30 minutes prior to your scheduled procedure. Children: Do not bring any children with you. Dress appropriately: Bring dark clothing  that you would not mind if they get stained. Valuables: Do not bring any jewelry or valuables.  Reasons to call and reschedule or cancel your procedure: (Following these recommendations will minimize the risk of a serious complication.) Surgeries: Avoid having procedures within 2 weeks of any surgery. (Avoid for 2 weeks before or after any surgery). Flu Shots: Avoid having procedures within 2 weeks of a flu shots or . (Avoid for 2 weeks before or after immunizations). Barium: Avoid having a procedure within 7-10 days after having had a radiological study involving the use of radiological contrast. (Myelograms, Barium swallow or enema study). Heart attacks: Avoid any elective procedures or surgeries for the initial 6 months after a "Myocardial Infarction" (Heart Attack). Blood thinners: It is imperative that you stop these medications before procedures. Let us know if you if you take any blood thinner.  Infection: Avoid procedures during or within two weeks of an infection (including chest colds or gastrointestinal problems). Symptoms associated with infections include: Localized redness, fever, chills, night sweats or profuse sweating, burning sensation when voiding, cough, congestion, stuffiness, runny nose, sore throat, diarrhea, nausea, vomiting, cold or Flu symptoms, recent or current infections. It is specially important if the infection is over the area that we intend to treat. Heart and lung problems: Symptoms that may suggest an active cardiopulmonary problem include: cough, chest pain, breathing difficulties or shortness of breath, dizziness, ankle swelling, uncontrolled high or unusually low blood pressure, and/or palpitations. If you are experiencing any of these symptoms, cancel your procedure and contact your primary care physician for an evaluation.  Remember:  Regular Business hours are:  Monday to Thursday 8:00 AM to 4:00 PM  Provider's Schedule: Milinda Pointer, MD:  Procedure  days: Tuesday and  Thursday 7:30 AM to 4:00 PM  Bilal Lateef, MD:  Procedure days: Monday and Wednesday 7:30 AM to 4:00 PM ____________________________________________________________________________________________  ____________________________________________________________________________________________  General Risks and Possible Complications  Patient Responsibilities: It is important that you read this as it is part of your informed consent. It is our duty to inform you of the risks and possible complications associated with treatments offered to you. It is your responsibility as a patient to read this and to ask questions about anything that is not clear or that you believe was not covered in this document.  Patient's Rights: You have the right to refuse treatment. You also have the right to change your mind, even after initially having agreed to have the treatment done. However, under this last option, if you wait until the last second to change your mind, you may be charged for the materials used up to that point.  Introduction: Medicine is not an exact science. Everything in Medicine, including the lack of treatment(s), carries the potential for danger, harm, or loss (which is by definition: Risk). In Medicine, a complication is a secondary problem, condition, or disease that can aggravate an already existing one. All treatments carry the risk of possible complications. The fact that a side effects or complications occurs, does not imply that the treatment was conducted incorrectly. It must be clearly understood that these can happen even when everything is done following the highest safety standards.  No treatment: You can choose not to proceed with the proposed treatment alternative. The "PRO(s)" would include: avoiding the risk of complications associated with the therapy. The "CON(s)" would include: not getting any of the treatment benefits. These benefits fall under one of three  categories: diagnostic; therapeutic; and/or palliative. Diagnostic benefits include: getting information which can ultimately lead to improvement of the disease or symptom(s). Therapeutic benefits are those associated with the successful treatment of the disease. Finally, palliative benefits are those related to the decrease of the primary symptoms, without necessarily curing the condition (example: decreasing the pain from a flare-up of a chronic condition, such as incurable terminal cancer).  General Risks and Complications: These are associated to most interventional treatments. They can occur alone, or in combination. They fall under one of the following six (6) categories: no benefit or worsening of symptoms; bleeding; infection; nerve damage; allergic reactions; and/or death. No benefits or worsening of symptoms: In Medicine there are no guarantees, only probabilities. No healthcare provider can ever guarantee that a medical treatment will work, they can only state the probability that it may. Furthermore, there is always the possibility that the condition may worsen, either directly, or indirectly, as a consequence of the treatment. Bleeding: This is more common if the patient is taking a blood thinner, either prescription or over the counter (example: Goody Powders, Fish oil, Aspirin, Garlic, etc.), or if suffering a condition associated with impaired coagulation (example: Hemophilia, cirrhosis of the liver, low platelet counts, etc.). However, even if you do not have one on these, it can still happen. If you have any of these conditions, or take one of these drugs, make sure to notify your treating physician. Infection: This is more common in patients with a compromised immune system, either due to disease (example: diabetes, cancer, human immunodeficiency virus [HIV], etc.), or due to medications or treatments (example: therapies used to treat cancer and rheumatological diseases). However, even if you  do not have one on these, it can still happen. If you have any of these conditions, or   take one of these drugs, make sure to notify your treating physician. Nerve Damage: This is more common when the treatment is an invasive one, but it can also happen with the use of medications, such as those used in the treatment of cancer. The damage can occur to small secondary nerves, or to large primary ones, such as those in the spinal cord and brain. This damage may be temporary or permanent and it may lead to impairments that can range from temporary numbness to permanent paralysis and/or brain death. Allergic Reactions: Any time a substance or material comes in contact with our body, there is the possibility of an allergic reaction. These can range from a mild skin rash (contact dermatitis) to a severe systemic reaction (anaphylactic reaction), which can result in death. Death: In general, any medical intervention can result in death, most of the time due to an unforeseen complication. ____________________________________________________________________________________________ Preparing for your procedure (without sedation) Instructions: Oral Intake: Do not eat or drink anything for at least 3 hours prior to your procedure. Transportation: Unless otherwise stated by your physician, you may drive yourself after the procedure. Blood Pressure Medicine: Take your blood pressure medicine with a sip of water the morning of the procedure. Insulin: Take only  of your normal insulin dose. Preventing infections: Shower with an antibacterial soap the morning of your procedure. Build-up your immune system: Take 1000 mg of Vitamin C with every meal (3 times a day) the day prior to your procedure. Pregnancy: If you are pregnant, call and cancel the procedure. Sickness: If you have a cold, fever, or any active infections, call and cancel the procedure. Arrival: You must be in the facility at least 30 minutes prior to your  scheduled procedure. Children: Do not bring any children with you. Dress appropriately: Bring dark clothing that you would not mind if they get stained. Valuables: Do not bring any jewelry or valuables. Procedure appointments are reserved for interventional treatments only. No Prescription Refills. No medication changes will be discussed during procedure appointments. No disability issues will be discussed.

## 2021-05-20 NOTE — Progress Notes (Signed)
Safety precautions to be maintained throughout the outpatient stay will include: orient to surroundings, keep bed in low position, maintain call bell within reach at all times, provide assistance with transfer out of bed and ambulation.  

## 2021-05-22 ENCOUNTER — Other Ambulatory Visit: Payer: Self-pay

## 2021-05-22 ENCOUNTER — Encounter
Admission: RE | Admit: 2021-05-22 | Discharge: 2021-05-22 | Disposition: A | Discharge status: Home / Self Care | Payer: HMO | Source: Ambulatory Visit | Attending: Surgery | Admitting: Surgery

## 2021-05-22 NOTE — Patient Instructions (Addendum)
Your procedure is scheduled on:06-02-21 Tuesday Report to the Registration Desk on the 1st floor of the [facility]-Then proceed to the 2nd floor Surgery Desk in the [facility] To find out your arrival time, please call [phone] between 1PM - 3PM on:06-01-21 Monday  REMEMBER: Instructions that are not followed completely may result in serious medical risk, up to and including death; or upon the discretion of your surgeon and anesthesiologist your surgery may need to be rescheduled.  Do not eat food after midnight the night before surgery.  No gum chewing, lozengers or hard candies.  You may however, drink Water up to 2 hours before you are scheduled to arrive for your surgery. Do not drink anything within 2 hours of your scheduled arrival time.  Type 1 and Type 2 diabetics should only drink water.  In addition, your doctor has ordered for you to drink the provided  Gatorade G2 Drinking this carbohydrate drink up to two hours before surgery helps to reduce insulin resistance and improve patient outcomes. Please complete drinking 2 hours prior to scheduled arrival time.  TAKE THESE MEDICATIONS THE MORNING OF SURGERY WITH A SIP OF WATER: -Amlodipine (Norvasc)  Take 1/2 of usual insulin dose the night before surgery and none on the morning of surgery  Call Dr Nicholaus Bloom office today (05-22-21) regarding when you need to stop your 81 mg Aspirin [phone])  One week prior to surgery: Stop Anti-inflammatories (NSAIDS) such as Advil, Aleve, Ibuprofen, Motrin, Naproxen, Naprosyn and Aspirin based products such as Excedrin, Goodys Powder, BC Powder, Bayer Back and Body.You may however, continue to take Tylenol if needed for pain up until the day of surgery.  Stop ANY OVER THE COUNTER supplements/vitamins 7 days prior to surgery   No Alcohol for 24 hours before or after surgery.  No Smoking including e-cigarettes for 24 hours prior to surgery.  No chewable tobacco products for at least 6  hours prior to surgery.  No nicotine patches on the day of surgery.  Do not use any "recreational" drugs for at least a week prior to your surgery.  Please be advised that the combination of cocaine and anesthesia may have negative outcomes, up to and including death. If you test positive for cocaine, your surgery will be cancelled.  On the morning of surgery brush your teeth with toothpaste and water, you may rinse your mouth with mouthwash if you wish. Do not swallow any toothpaste or mouthwash.  Do not wear jewelry, make-up, hairpins, clips or nail polish.  Do not wear lotions, powders, or perfumes.   Do not shave body from the neck down 48 hours prior to surgery just in case you cut yourself which could leave a site for infection.  Also, freshly shaved skin may become irritated if using the CHG soap.  Contact lenses, hearing aids and dentures may not be worn into surgery.  Do not bring valuables to the hospital. [facility] is not responsible for any missing/lost belongings or valuables.   Use CHG Soap as directed on instruction sheet.  Notify your doctor if there is any change in your medical condition (cold, fever, infection).  Wear comfortable clothing (specific to your surgery type) to the hospital.  After surgery, you can help prevent lung complications by doing breathing exercises.  Take deep breaths and cough every 1-2 hours. Your doctor may order a device called an Incentive Spirometer to help you take deep breaths. When coughing or sneezing, hold a pillow firmly against your incision with  both hands. This is called "splinting." Doing this helps protect your incision. It also decreases belly discomfort.  If you are being admitted to the hospital overnight, leave your suitcase in the car. After surgery it may be brought to your room.  If you are being discharged the day of surgery, you will not be allowed to drive home. You will need a responsible adult (18 years or  older) to drive you home and stay with you that night.   If you are taking public transportation, you will need to have a responsible adult (18 years or older) with you. Please confirm with your physician that it is acceptable to use public transportation.   Please call the [employer] [facility] at [phone] if you have any questions about these instructions.  Surgery Visitation Policy:  Patients undergoing a surgery or procedure may have one family member or support person with them as long as that person is not COVID-19 positive or experiencing its symptoms.  That person may remain in the waiting area during the procedure.  Inpatient Visitation:    Visiting hours are 7 a.m. to 8 p.m. Inpatients will be allowed two visitors daily. The visitors may change each day during the patient's stay. No visitors under the age of 15. Any visitor under the age of 17 must be accompanied by an adult. The visitor must pass COVID-19 screenings, use hand sanitizer when entering and exiting the patient's room and wear a mask at all times, including in the patient's room. Patients must also wear a mask when staff or their visitor are in the room. Masking is required regardless of vaccination status.

## 2021-05-22 NOTE — Progress Notes (Signed)
Perioperative Services Pre-Admission/Anesthesia Testing    Date: 05/27/21  Name: Antonio Matera Sr. MRN:   [id number]  Re: Clearance prior to surgery   Case: [id number] Date/Time: 06/02/21 0715   Procedure: UNICOMPARTMENTAL KNEE (Left: Knee)   Anesthesia type: Choice   Pre-op diagnosis: PRIMARY OSTEOARTHRITIS OF LEFT KNEE.   Location: [facility] OR ROOM 03 / [facility]   Surgeons: Corky Mull, MD     Patient scheduled for the above procedure on 06/02/2021 with Dr. [name], MD. patient previously reviewed by PAT APP prior to elective orthopedic surgery that was done on 04/15/2021; see my note dated 04/14/2021.  Interval health history reviewed.  Patient underwent an uncomplicated trigger finger release on 04/15/2021.  Patient discharged home on POD-0.  Since patient's hand surgery, he underwent a lumbar facet block (MBNB) with pain management provider Dossie Arbour, MD).  There have been no other changes to his health history or additions to his medication regimen since last review.  Patient previously cleared by cardiology [name], MD) to proceed with planned surgical intervention at an overall MODERATE risk for significant perioperative cardiovascular complications.  Patient cleared to hold his daily low-dose ASA for 5 days prior to his procedure with plans to restart as soon as postoperative bleeding risk felt to be minimized by primary attending surgeon.  Patient has been instructed that his last dose of ASA will be on 05/27/2021.  Pertinent Clinical Results:    Hospital Outpatient Visit on 05/27/2021  Component Date Value Ref Range Status   WBC 05/27/2021 6.0  4.0 - 10.5 K/uL Final   RBC 05/27/2021 3.97 (A) 4.22 - 5.81 MIL/uL Final   Hemoglobin 05/27/2021 11.8 (A) 13.0 - 17.0 g/dL Final   HCT 05/27/2021 35.3 (A) 39.0 - 52.0 % Final   MCV 05/27/2021 88.9  80.0 - 100.0 fL Final   MCH 05/27/2021 29.7  26.0 - 34.0 pg Final   MCHC 05/27/2021 33.4  30.0 - 36.0 g/dL  Final   RDW 05/27/2021 14.0  11.5 - 15.5 % Final   Platelets 05/27/2021 193  150 - 400 K/uL Final   nRBC 05/27/2021 0.0  0.0 - 0.2 % Final   Neutrophils Relative % 05/27/2021 49  % Final   Neutro Abs 05/27/2021 2.9  1.7 - 7.7 K/uL Final   Lymphocytes Relative 05/27/2021 31  % Final   Lymphs Abs 05/27/2021 1.9  0.7 - 4.0 K/uL Final   Monocytes Relative 05/27/2021 11  % Final   Monocytes Absolute 05/27/2021 0.7  0.1 - 1.0 K/uL Final   Eosinophils Relative 05/27/2021 7  % Final   Eosinophils Absolute 05/27/2021 0.4  0.0 - 0.5 K/uL Final   Basophils Relative 05/27/2021 2  % Final   Basophils Absolute 05/27/2021 0.1  0.0 - 0.1 K/uL Final   Immature Granulocytes 05/27/2021 0  % Final   Abs Immature Granulocytes 05/27/2021 0.02  0.00 - 0.07 K/uL Final   Performed at [facility], [place], [place] [address]   Sodium 05/27/2021 139  135 - 145 mmol/L Final   Potassium 05/27/2021 3.7  3.5 - 5.1 mmol/L Final   Chloride 05/27/2021 109  98 - 111 mmol/L Final   CO2 05/27/2021 24  22 - 32 mmol/L Final   Glucose, Bld 05/27/2021 223 (A) 70 - 99 mg/dL Final   Glucose reference range applies only to samples taken after fasting for at least 8 hours.   BUN 05/27/2021 23  8 - 23 mg/dL Final  Creatinine, Ser 05/27/2021 1.00  0.61 - 1.24 mg/dL Final   Calcium 05/27/2021 9.0  8.9 - 10.3 mg/dL Final   Total Protein 05/27/2021 6.5  6.5 - 8.1 g/dL Final   Albumin 05/27/2021 3.6  3.5 - 5.0 g/dL Final   AST 05/27/2021 21  15 - 41 U/L Final   ALT 05/27/2021 15  0 - 44 U/L Final   Alkaline Phosphatase 05/27/2021 68  38 - 126 U/L Final   Total Bilirubin 05/27/2021 1.0  0.3 - 1.2 mg/dL Final   GFR, Estimated 05/27/2021 >60  >60 mL/min Final   Comment: (NOTE) Calculated using the CKD-EPI Creatinine Equation (2021)    Anion gap 05/27/2021 6  5 - 15 Final   Performed at [facility], [place], [place] [address]   Color, Urine 05/27/2021 YELLOW (A) YELLOW Final    APPearance 05/27/2021 HAZY (A) CLEAR Final   Specific Gravity, Urine 05/27/2021 1.032 (A) 1.005 - 1.030 Final   pH 05/27/2021 5.0  5.0 - 8.0 Final   Glucose, UA 05/27/2021 50 (A) NEGATIVE mg/dL Final   Hgb urine dipstick 05/27/2021 NEGATIVE  NEGATIVE Final   Bilirubin Urine 05/27/2021 NEGATIVE  NEGATIVE Final   Ketones, ur 05/27/2021 5 (A) NEGATIVE mg/dL Final   Protein, ur 05/27/2021 30 (A) NEGATIVE mg/dL Final   Nitrite 05/27/2021 NEGATIVE  NEGATIVE Final   Leukocytes,Ua 05/27/2021 NEGATIVE  NEGATIVE Final   RBC / HPF 05/27/2021 0-5  0 - 5 RBC/hpf Final   WBC, UA 05/27/2021 0-5  0 - 5 WBC/hpf Final   Bacteria, UA 05/27/2021 RARE (A) NONE SEEN Final   Squamous Epithelial / LPF 05/27/2021 0-5  0 - 5 Final   Mucus 05/27/2021 PRESENT   Final   Ca Oxalate Crys, UA 05/27/2021 PRESENT   Final   Performed at [facility], [address] [place], [place] [address]   MRSA, PCR 05/27/2021 NEGATIVE  NEGATIVE Final   Staphylococcus aureus 05/27/2021 NEGATIVE  NEGATIVE Final   Comment: (NOTE) The Xpert SA Assay (FDA approved for NASAL specimens in patients 17 years of age and older), is one component of a comprehensive surveillance program. It is not intended to diagnose infection nor to guide or monitor treatment. Performed at [facility], [address] [place], [place] [address]     ECG: Date: 04/10/2021 Time ECG obtained: 0836 AM Rate: 62 bpm Rhythm: Sinus rhythm with sinus arrhythmia Axis (leads I and aVF): Normal Intervals: PR 190 ms. QRS 90 ms. QTc 418 ms. ST segment and T wave changes: No evidence of acute ST segment elevation or depression Comparison: Similar to previous tracing obtained on 01/01/2018  Impression and Plan:  Antonio Favor Sr. has been referred for pre-anesthesia review and clearance prior to him undergoing the planned anesthetic and procedural courses. Available labs, pertinent testing, and imaging results were personally reviewed by  me. This patient has been appropriately cleared by cardiology with an overall MODERATE risk of significant perioperative cardiovascular complications.  Based on clinical review performed today (05/22/21), barring any significant acute changes in the patient's overall condition, it is anticipated that he will be able to proceed with the planned surgical intervention. Any acute changes in clinical condition may necessitate his procedure being postponed and/or cancelled. Patient will meet with anesthesia team (MD and/or CRNA) on this day of his procedure for preoperative evaluation/assessment.   Pre-surgical instructions were reviewed with the patient during his PAT appointment and questions were fielded by PAT clinical staff. Patient  was advised that if any questions or concerns arise prior to his procedure then he should return a call to PAT and/or his surgeon's office to discuss.  Honor Loh, MSN, APRN, FNP-C, CEN [facility]  Peri-operative Services Nurse Practitioner Phone: [phone] 05/27/21 10:32 AM  NOTE: This note has been prepared using Dragon dictation software. Despite my best ability to proofread, there is always the potential that unintentional transcriptional errors may still occur from this process.

## 2021-05-23 LAB — 25-HYDROXY VITAMIN D LCMS D2+D3
25-Hydroxy, Vitamin D-2: <1 ng/mL
25-Hydroxy, Vitamin D-3: 44 ng/mL
25-Hydroxy, Vitamin D: 45 ng/mL

## 2021-05-27 ENCOUNTER — Other Ambulatory Visit: Payer: Self-pay

## 2021-05-27 ENCOUNTER — Encounter
Admission: RE | Admit: 2021-05-27 | Discharge: 2021-05-27 | Disposition: A | Discharge status: Home / Self Care | Payer: HMO | Source: Ambulatory Visit | Attending: Surgery | Admitting: Surgery

## 2021-05-27 DIAGNOSIS — Z01812 Encounter for preprocedural laboratory examination: Secondary | ICD-10-CM | POA: Insufficient documentation

## 2021-05-27 LAB — URINALYSIS, ROUTINE W REFLEX MICROSCOPIC
Bilirubin Urine: NEGATIVE
Glucose, UA: 50 mg/dL — AB
Hgb urine dipstick: NEGATIVE
Ketones, ur: 5 mg/dL — AB
Leukocytes,Ua: NEGATIVE
Nitrite: NEGATIVE
Protein, ur: 30 mg/dL — AB
Specific Gravity, Urine: 1.032 — ABNORMAL HIGH (ref 1.005–1.030)
pH: 5 (ref 5.0–8.0)

## 2021-05-27 LAB — CBC WITH DIFFERENTIAL/PLATELET
Abs Immature Granulocytes: 0.02 10*3/uL (ref 0.00–0.07)
Basophils Absolute: 0.1 10*3/uL (ref 0.0–0.1)
Basophils Relative: 2 %
Eosinophils Absolute: 0.4 10*3/uL (ref 0.0–0.5)
Eosinophils Relative: 7 %
HCT: 35.3 % — ABNORMAL LOW (ref 39.0–52.0)
Hemoglobin: 11.8 g/dL — ABNORMAL LOW (ref 13.0–17.0)
Immature Granulocytes: 0 %
Lymphocytes Relative: 31 %
Lymphs Abs: 1.9 10*3/uL (ref 0.7–4.0)
MCH: 29.7 pg (ref 26.0–34.0)
MCHC: 33.4 g/dL (ref 30.0–36.0)
MCV: 88.9 fL (ref 80.0–100.0)
Monocytes Absolute: 0.7 10*3/uL (ref 0.1–1.0)
Monocytes Relative: 11 %
Neutro Abs: 2.9 10*3/uL (ref 1.7–7.7)
Neutrophils Relative %: 49 %
Platelets: 193 10*3/uL (ref 150–400)
RBC: 3.97 MIL/uL — ABNORMAL LOW (ref 4.22–5.81)
RDW: 14 % (ref 11.5–15.5)
WBC: 6 10*3/uL (ref 4.0–10.5)
nRBC: 0 % (ref 0.0–0.2)

## 2021-05-27 LAB — COMPREHENSIVE METABOLIC PANEL
ALT: 15 U/L (ref 0–44)
AST: 21 U/L (ref 15–41)
Albumin: 3.6 g/dL (ref 3.5–5.0)
Alkaline Phosphatase: 68 U/L (ref 38–126)
Anion gap: 6 (ref 5–15)
BUN: 23 mg/dL (ref 8–23)
CO2: 24 mmol/L (ref 22–32)
Calcium: 9 mg/dL (ref 8.9–10.3)
Chloride: 109 mmol/L (ref 98–111)
Creatinine, Ser: 1 mg/dL (ref 0.61–1.24)
GFR, Estimated: >60 mL/min (ref >60)
Glucose, Bld: 223 mg/dL — ABNORMAL HIGH (ref 70–99)
Potassium: 3.7 mmol/L (ref 3.5–5.1)
Sodium: 139 mmol/L (ref 135–145)
Total Bilirubin: 1 mg/dL (ref 0.3–1.2)
Total Protein: 6.5 g/dL (ref 6.5–8.1)

## 2021-05-27 LAB — SURGICAL PCR SCREEN
MRSA, PCR: NEGATIVE
Staphylococcus aureus: NEGATIVE

## 2021-05-28 LAB — COMPLIANCE DRUG ANALYSIS, UR

## 2021-05-29 ENCOUNTER — Other Ambulatory Visit: Payer: Self-pay

## 2021-05-29 ENCOUNTER — Other Ambulatory Visit
Admission: RE | Admit: 2021-05-29 | Discharge: 2021-05-29 | Disposition: A | Discharge status: Home / Self Care | Payer: HMO | Source: Ambulatory Visit | Attending: Surgery | Admitting: Surgery

## 2021-05-29 DIAGNOSIS — Z01812 Encounter for preprocedural laboratory examination: Secondary | ICD-10-CM | POA: Insufficient documentation

## 2021-05-29 DIAGNOSIS — Z20822 Contact with and (suspected) exposure to covid-19: Secondary | ICD-10-CM | POA: Insufficient documentation

## 2021-05-29 LAB — SARS CORONAVIRUS 2 (TAT 6-24 HRS): SARS Coronavirus 2: NEGATIVE

## 2021-06-02 ENCOUNTER — Encounter
Admission: RE | Disposition: A | Discharge status: Home / Self Care | Payer: Self-pay | Source: Home / Self Care | Attending: Surgery

## 2021-06-02 ENCOUNTER — Other Ambulatory Visit: Payer: Self-pay

## 2021-06-02 ENCOUNTER — Inpatient Hospital Stay: Payer: HMO

## 2021-06-02 ENCOUNTER — Ambulatory Visit
Admission: RE | Admit: 2021-06-02 | Discharge: 2021-06-02 | Disposition: A | Discharge status: Home / Self Care | Payer: HMO | Attending: Surgery | Admitting: Surgery

## 2021-06-02 ENCOUNTER — Inpatient Hospital Stay: Payer: HMO | Admitting: Urgent Care

## 2021-06-02 ENCOUNTER — Encounter: Payer: Self-pay | Admitting: Surgery

## 2021-06-02 DIAGNOSIS — Z87891 Personal history of nicotine dependence: Secondary | ICD-10-CM | POA: Insufficient documentation

## 2021-06-02 DIAGNOSIS — Z96652 Presence of left artificial knee joint: Secondary | ICD-10-CM

## 2021-06-02 DIAGNOSIS — M1712 Unilateral primary osteoarthritis, left knee: Secondary | ICD-10-CM | POA: Diagnosis not present

## 2021-06-02 DIAGNOSIS — Z951 Presence of aortocoronary bypass graft: Secondary | ICD-10-CM | POA: Diagnosis not present

## 2021-06-02 DIAGNOSIS — Z952 Presence of prosthetic heart valve: Secondary | ICD-10-CM | POA: Insufficient documentation

## 2021-06-02 DIAGNOSIS — E785 Hyperlipidemia, unspecified: Secondary | ICD-10-CM | POA: Diagnosis not present

## 2021-06-02 DIAGNOSIS — Z7982 Long term (current) use of aspirin: Secondary | ICD-10-CM | POA: Diagnosis not present

## 2021-06-02 DIAGNOSIS — Z8619 Personal history of other infectious and parasitic diseases: Secondary | ICD-10-CM | POA: Insufficient documentation

## 2021-06-02 DIAGNOSIS — Z885 Allergy status to narcotic agent status: Secondary | ICD-10-CM | POA: Insufficient documentation

## 2021-06-02 DIAGNOSIS — Z79899 Other long term (current) drug therapy: Secondary | ICD-10-CM | POA: Diagnosis not present

## 2021-06-02 DIAGNOSIS — Z794 Long term (current) use of insulin: Secondary | ICD-10-CM | POA: Diagnosis not present

## 2021-06-02 DIAGNOSIS — M6281 Muscle weakness (generalized): Secondary | ICD-10-CM | POA: Diagnosis not present

## 2021-06-02 DIAGNOSIS — Z888 Allergy status to other drugs, medicaments and biological substances status: Secondary | ICD-10-CM | POA: Insufficient documentation

## 2021-06-02 DIAGNOSIS — Z471 Aftercare following joint replacement surgery: Secondary | ICD-10-CM | POA: Diagnosis not present

## 2021-06-02 HISTORY — PX: PARTIAL KNEE ARTHROPLASTY: SHX2174

## 2021-06-02 LAB — GLUCOSE, CAPILLARY
Glucose-Capillary: 106 mg/dL — ABNORMAL HIGH (ref 70–99)
Glucose-Capillary: 141 mg/dL — ABNORMAL HIGH (ref 70–99)

## 2021-06-02 SURGERY — ARTHROPLASTY, KNEE, UNICOMPARTMENTAL
Anesthesia: Monitor Anesthesia Care | Site: Knee | Laterality: Left

## 2021-06-02 MED ORDER — ONDANSETRON HCL 4 MG/2ML IJ SOLN: 4.0000 mg | Freq: Once | INTRAMUSCULAR | Status: DC | PRN

## 2021-06-02 MED ORDER — METOCLOPRAMIDE HCL 5 MG/ML IJ SOLN
5.0000 mg | Freq: Three times a day (TID) | INTRAMUSCULAR | Status: DC | PRN
Start: 2021-06-02 — End: 2021-06-02

## 2021-06-02 MED ORDER — KETOROLAC TROMETHAMINE 15 MG/ML IJ SOLN
7.5000 mg | Freq: Four times a day (QID) | INTRAMUSCULAR | Status: DC
Start: 2021-06-02 — End: 2021-06-02

## 2021-06-02 MED ORDER — GLYCOPYRROLATE 0.2 MG/ML IJ SOLN
INTRAMUSCULAR | Status: DC | PRN
  Administered 2021-06-02: .2 mg via INTRAVENOUS

## 2021-06-02 MED ORDER — BUPIVACAINE-EPINEPHRINE (PF) 0.5% -1:200000 IJ SOLN
INTRAMUSCULAR | Status: AC
  Filled 2021-06-02: qty 30

## 2021-06-02 MED ORDER — OXYCODONE HCL 5 MG PO TABS
ORAL_TABLET | ORAL | Status: AC
  Filled 2021-06-02: qty 1

## 2021-06-02 MED ORDER — OXYCODONE HCL 5 MG PO TABS
5.0000 mg | ORAL_TABLET | ORAL | Status: DC | PRN
  Administered 2021-06-02: 5 mg via ORAL

## 2021-06-02 MED ORDER — FAMOTIDINE 20 MG PO TABS
ORAL_TABLET | ORAL | Status: AC
  Administered 2021-06-02: 20 mg via ORAL
  Filled 2021-06-02: qty 1

## 2021-06-02 MED ORDER — SODIUM CHLORIDE 0.9 % IV SOLN: INTRAVENOUS | Status: DC

## 2021-06-02 MED ORDER — BUPIVACAINE-EPINEPHRINE (PF) 0.5% -1:200000 IJ SOLN
INTRAMUSCULAR | Status: DC | PRN
  Administered 2021-06-02: 30 mL

## 2021-06-02 MED ORDER — SODIUM CHLORIDE 0.9 % IR SOLN
Status: DC | PRN
  Administered 2021-06-02: 3000 mL

## 2021-06-02 MED ORDER — ACETAMINOPHEN 500 MG PO TABS: 1000.0000 mg | ORAL_TABLET | Freq: Four times a day (QID) | ORAL | Status: DC

## 2021-06-02 MED ORDER — APIXABAN 2.5 MG PO TABS: 2.5000 mg | ORAL_TABLET | Freq: Two times a day (BID) | ORAL | 0 refills | Status: DC

## 2021-06-02 MED ORDER — LIDOCAINE HCL (CARDIAC) PF 100 MG/5ML IV SOSY
PREFILLED_SYRINGE | INTRAVENOUS | Status: DC | PRN
  Administered 2021-06-02: 30 mg via INTRAVENOUS

## 2021-06-02 MED ORDER — BUPIVACAINE HCL (PF) 0.5 % IJ SOLN
INTRAMUSCULAR | Status: DC | PRN
  Administered 2021-06-02: 3 mL via INTRATHECAL

## 2021-06-02 MED ORDER — BUPIVACAINE HCL (PF) 0.5 % IJ SOLN
INTRAMUSCULAR | Status: AC
  Filled 2021-06-02: qty 10

## 2021-06-02 MED ORDER — ONDANSETRON HCL 4 MG/2ML IJ SOLN
INTRAMUSCULAR | Status: DC | PRN
  Administered 2021-06-02: 4 mg via INTRAVENOUS

## 2021-06-02 MED ORDER — SODIUM CHLORIDE 0.9 % BOLUS PEDS
250.0000 mL | Freq: Once | INTRAVENOUS | Status: AC
  Administered 2021-06-02: 250 mL via INTRAVENOUS

## 2021-06-02 MED ORDER — ONDANSETRON HCL 4 MG PO TABS: 4.0000 mg | ORAL_TABLET | Freq: Four times a day (QID) | ORAL | Status: DC | PRN

## 2021-06-02 MED ORDER — DEXAMETHASONE SODIUM PHOSPHATE 4 MG/ML IJ SOLN
INTRAMUSCULAR | Status: DC | PRN
  Administered 2021-06-02: 4 mg via INTRAVENOUS

## 2021-06-02 MED ORDER — ACETAMINOPHEN 10 MG/ML IV SOLN
INTRAVENOUS | Status: AC
  Filled 2021-06-02: qty 100

## 2021-06-02 MED ORDER — LIDOCAINE HCL (PF) 2 % IJ SOLN
INTRAMUSCULAR | Status: AC
  Filled 2021-06-02: qty 10

## 2021-06-02 MED ORDER — FENTANYL CITRATE (PF) 100 MCG/2ML IJ SOLN: 25.0000 ug | INTRAMUSCULAR | Status: DC | PRN

## 2021-06-02 MED ORDER — SODIUM CHLORIDE 0.9 % IV SOLN
INTRAVENOUS | Status: DC | PRN
  Administered 2021-06-02: 50 ug/min via INTRAVENOUS

## 2021-06-02 MED ORDER — FENTANYL CITRATE (PF) 100 MCG/2ML IJ SOLN
INTRAMUSCULAR | Status: DC | PRN
  Administered 2021-06-02: 50 ug via INTRAVENOUS

## 2021-06-02 MED ORDER — FAMOTIDINE 20 MG PO TABS: 20.0000 mg | ORAL_TABLET | Freq: Once | ORAL | Status: AC

## 2021-06-02 MED ORDER — KETOROLAC TROMETHAMINE 15 MG/ML IJ SOLN: 15.0000 mg | Freq: Once | INTRAMUSCULAR | Status: AC

## 2021-06-02 MED ORDER — CHLORHEXIDINE GLUCONATE 0.12 % MT SOLN
OROMUCOSAL | Status: AC
  Administered 2021-06-02: 15 mL via OROMUCOSAL
  Filled 2021-06-02: qty 15

## 2021-06-02 MED ORDER — ONDANSETRON HCL 4 MG/2ML IJ SOLN: 4.0000 mg | Freq: Four times a day (QID) | INTRAMUSCULAR | Status: DC | PRN

## 2021-06-02 MED ORDER — TRANEXAMIC ACID 1000 MG/10ML IV SOLN
INTRAVENOUS | Status: DC | PRN
  Administered 2021-06-02: 10000 mg via TOPICAL

## 2021-06-02 MED ORDER — TRANEXAMIC ACID 1000 MG/10ML IV SOLN
INTRAVENOUS | Status: AC
  Filled 2021-06-02: qty 10

## 2021-06-02 MED ORDER — PROPOFOL 10 MG/ML IV BOLUS
INTRAVENOUS | Status: DC | PRN
  Administered 2021-06-02: 20 mg via INTRAVENOUS
  Administered 2021-06-02: 30 mg via INTRAVENOUS

## 2021-06-02 MED ORDER — BUPIVACAINE LIPOSOME 1.3 % IJ SUSP
INTRAMUSCULAR | Status: AC
  Filled 2021-06-02: qty 20

## 2021-06-02 MED ORDER — SODIUM CHLORIDE (PF) 0.9 % IJ SOLN
INTRAMUSCULAR | Status: DC | PRN
  Administered 2021-06-02: 10 mL

## 2021-06-02 MED ORDER — PHENYLEPHRINE HCL (PRESSORS) 10 MG/ML IV SOLN
INTRAVENOUS | Status: DC | PRN
  Administered 2021-06-02 (×3): 100 ug via INTRAVENOUS
  Administered 2021-06-02: 50 ug via INTRAVENOUS
  Administered 2021-06-02: 100 ug via INTRAVENOUS
  Administered 2021-06-02: 50 ug via INTRAVENOUS
  Administered 2021-06-02: 100 ug via INTRAVENOUS
  Administered 2021-06-02: 50 ug via INTRAVENOUS
  Administered 2021-06-02: 100 ug via INTRAVENOUS

## 2021-06-02 MED ORDER — CEFAZOLIN SODIUM-DEXTROSE 2-4 GM/100ML-% IV SOLN: 2.0000 g | Freq: Four times a day (QID) | INTRAVENOUS | Status: DC

## 2021-06-02 MED ORDER — KETOROLAC TROMETHAMINE 15 MG/ML IJ SOLN
INTRAMUSCULAR | Status: AC
  Administered 2021-06-02: 15 mg via INTRAVENOUS
  Filled 2021-06-02: qty 1

## 2021-06-02 MED ORDER — CEFAZOLIN SODIUM-DEXTROSE 2-4 GM/100ML-% IV SOLN
INTRAVENOUS | Status: AC
  Administered 2021-06-02: 2 g via INTRAVENOUS
  Filled 2021-06-02: qty 100

## 2021-06-02 MED ORDER — METOCLOPRAMIDE HCL 10 MG PO TABS: 5.0000 mg | ORAL_TABLET | Freq: Three times a day (TID) | ORAL | Status: DC | PRN

## 2021-06-02 MED ORDER — PROPOFOL 10 MG/ML IV BOLUS
INTRAVENOUS | Status: AC
  Filled 2021-06-02: qty 60

## 2021-06-02 MED ORDER — MEPERIDINE HCL 25 MG/ML IJ SOLN: 6.2500 mg | INTRAMUSCULAR | Status: DC | PRN

## 2021-06-02 MED ORDER — STERILE WATER FOR IRRIGATION IR SOLN
Status: DC | PRN
  Administered 2021-06-02: 1000 mL

## 2021-06-02 MED ORDER — EPHEDRINE SULFATE 50 MG/ML IJ SOLN
INTRAMUSCULAR | Status: DC | PRN
  Administered 2021-06-02 (×6): 5 mg via INTRAVENOUS

## 2021-06-02 MED ORDER — BUPIVACAINE LIPOSOME 1.3 % IJ SUSP
INTRAMUSCULAR | Status: DC | PRN
  Administered 2021-06-02: 20 mL

## 2021-06-02 MED ORDER — PROPOFOL 1000 MG/100ML IV EMUL
INTRAVENOUS | Status: AC
  Filled 2021-06-02: qty 100

## 2021-06-02 MED ORDER — ORAL CARE MOUTH RINSE: 15.0000 mL | Freq: Once | OROMUCOSAL | Status: AC

## 2021-06-02 MED ORDER — CHLORHEXIDINE GLUCONATE 0.12 % MT SOLN: 15.0000 mL | Freq: Once | OROMUCOSAL | Status: AC

## 2021-06-02 MED ORDER — PROPOFOL 500 MG/50ML IV EMUL
INTRAVENOUS | Status: DC | PRN
  Administered 2021-06-02: 50 ug/kg/min via INTRAVENOUS

## 2021-06-02 MED ORDER — FENTANYL CITRATE (PF) 100 MCG/2ML IJ SOLN
INTRAMUSCULAR | Status: AC
  Filled 2021-06-02: qty 2

## 2021-06-02 MED ORDER — OXYCODONE HCL 5 MG PO TABS: 5.0000 mg | ORAL_TABLET | ORAL | 0 refills | Status: DC | PRN

## 2021-06-02 MED ORDER — CEFAZOLIN SODIUM-DEXTROSE 2-4 GM/100ML-% IV SOLN
INTRAVENOUS | Status: AC
  Filled 2021-06-02: qty 100

## 2021-06-02 MED ORDER — ONDANSETRON HCL 4 MG/2ML IJ SOLN
INTRAMUSCULAR | Status: AC
  Filled 2021-06-02: qty 4

## 2021-06-02 MED ORDER — SODIUM CHLORIDE FLUSH 0.9 % IV SOLN
INTRAVENOUS | Status: AC
  Filled 2021-06-02: qty 10

## 2021-06-02 MED ORDER — LACTATED RINGERS IV SOLN: INTRAVENOUS | Status: DC | PRN

## 2021-06-02 MED ORDER — CEFAZOLIN SODIUM-DEXTROSE 2-4 GM/100ML-% IV SOLN
2.0000 g | INTRAVENOUS | Status: AC
  Administered 2021-06-02: 2 g via INTRAVENOUS

## 2021-06-02 MED ORDER — ACETAMINOPHEN 10 MG/ML IV SOLN
INTRAVENOUS | Status: DC | PRN
  Administered 2021-06-02: 1000 mg via INTRAVENOUS

## 2021-06-02 MED ORDER — DEXAMETHASONE SODIUM PHOSPHATE 10 MG/ML IJ SOLN
INTRAMUSCULAR | Status: AC
  Filled 2021-06-02: qty 2

## 2021-06-02 SURGICAL SUPPLY — 66 items
APL PRP STRL LF DISP 70% ISPRP (MISCELLANEOUS) ×2
BEARING TIBIAL SZ 3 MED 3 (Knees) ×2 IMPLANT
BIT DRILL QUICK REL 1/8 2PK SL (DRILL) ×1 IMPLANT
BNDG ELASTIC 6X5.8 VLCR STR LF (GAUZE/BANDAGES/DRESSINGS) ×2 IMPLANT
BRNG TIB MED 3 PHS 3 LT MEN (Knees) ×1 IMPLANT
CANISTER SUCT 1200ML W/VALVE (MISCELLANEOUS) IMPLANT
CEMENT BONE R 1X40 (Cement) ×2 IMPLANT
CEMENT VACUUM MIXING SYSTEM (MISCELLANEOUS) ×2 IMPLANT
CHLORAPREP W/TINT 26 (MISCELLANEOUS) ×4 IMPLANT
COOLER POLAR GLACIER W/PUMP (MISCELLANEOUS) ×2 IMPLANT
COVER MAYO STAND REUSABLE (DRAPES) ×2 IMPLANT
CUFF TOURN SGL QUICK 24 (TOURNIQUET CUFF) ×2
CUFF TOURN SGL QUICK 34 (TOURNIQUET CUFF)
CUFF TRNQT CYL 24X4X16.5-23 (TOURNIQUET CUFF) ×1 IMPLANT
CUFF TRNQT CYL 34X4.125X (TOURNIQUET CUFF) IMPLANT
DRAPE C-ARM XRAY 36X54 (DRAPES) ×2 IMPLANT
DRILL QUICK RELEASE 1/8 INCH (DRILL) ×1
DRSG MEPILEX SACRM 8.7X9.8 (GAUZE/BANDAGES/DRESSINGS) ×2 IMPLANT
DRSG OPSITE POSTOP 4X12 (GAUZE/BANDAGES/DRESSINGS) ×2 IMPLANT
DRSG OPSITE POSTOP 4X6 (GAUZE/BANDAGES/DRESSINGS) ×2 IMPLANT
ELECT CAUTERY BLADE 6.4 (BLADE) ×2 IMPLANT
ELECT REM PT RETURN 9FT ADLT (ELECTROSURGICAL) ×2
ELECTRODE REM PT RTRN 9FT ADLT (ELECTROSURGICAL) ×1 IMPLANT
GAUZE 4X4 16PLY ~~LOC~~+RFID DBL (SPONGE) ×4 IMPLANT
GAUZE SPONGE 4X4 12PLY STRL (GAUZE/BANDAGES/DRESSINGS) ×2 IMPLANT
GAUZE XEROFORM 1X8 LF (GAUZE/BANDAGES/DRESSINGS) ×2 IMPLANT
GLOVE SRG 8 PF TXTR STRL LF DI (GLOVE) ×1 IMPLANT
GLOVE SURG ENC MOIS LTX SZ7.5 (GLOVE) ×8 IMPLANT
GLOVE SURG ENC MOIS LTX SZ8 (GLOVE) ×8 IMPLANT
GLOVE SURG UNDER LTX SZ8 (GLOVE) ×2 IMPLANT
GLOVE SURG UNDER POLY LF SZ8 (GLOVE) ×2
GOWN STRL REUS W/ TWL LRG LVL3 (GOWN DISPOSABLE) ×2 IMPLANT
GOWN STRL REUS W/ TWL XL LVL3 (GOWN DISPOSABLE) ×1 IMPLANT
GOWN STRL REUS W/TWL LRG LVL3 (GOWN DISPOSABLE) ×4
GOWN STRL REUS W/TWL XL LVL3 (GOWN DISPOSABLE) ×2
HOOD PEEL AWAY FLYTE STAYCOOL (MISCELLANEOUS) ×8 IMPLANT
IV NS IRRIG 3000ML ARTHROMATIC (IV SOLUTION) ×2 IMPLANT
KIT TURNOVER KIT A (KITS) ×2 IMPLANT
MANIFOLD NEPTUNE II (INSTRUMENTS) ×2 IMPLANT
MAT ABSORB  FLUID 56X50 GRAY (MISCELLANEOUS) ×1
MAT ABSORB FLUID 56X50 GRAY (MISCELLANEOUS) ×1 IMPLANT
NDL SAFETY ECLIPSE 18X1.5 (NEEDLE) ×1 IMPLANT
NEEDLE HYPO 18GX1.5 SHARP (NEEDLE) ×2
NEEDLE SPNL 20GX3.5 QUINCKE YW (NEEDLE) ×2 IMPLANT
NS IRRIG 1000ML POUR BTL (IV SOLUTION) IMPLANT
PACK BLADE SAW RECIP 70 3 PT (BLADE) ×2 IMPLANT
PACK TOTAL KNEE (MISCELLANEOUS) ×2 IMPLANT
PAD ABD DERMACEA PRESS 5X9 (GAUZE/BANDAGES/DRESSINGS) ×4 IMPLANT
PAD WRAPON POLAR KNEE (MISCELLANEOUS) ×1 IMPLANT
PEG TWIN FEM CEMENTED MED (Knees) ×2 IMPLANT
PULSAVAC PLUS IRRIG FAN TIP (DISPOSABLE) ×2
SPONGE T-LAP 18X18 ~~LOC~~+RFID (SPONGE) ×6 IMPLANT
STAPLER SKIN PROX 35W (STAPLE) ×2 IMPLANT
STRAP SAFETY 5IN WIDE (MISCELLANEOUS) ×2 IMPLANT
SUCTION FRAZIER HANDLE 10FR (MISCELLANEOUS) ×1
SUCTION TUBE FRAZIER 10FR DISP (MISCELLANEOUS) ×1 IMPLANT
SUT VIC AB 0 CT1 36 (SUTURE) ×2 IMPLANT
SUT VIC AB 2-0 CT1 27 (SUTURE) ×8
SUT VIC AB 2-0 CT1 TAPERPNT 27 (SUTURE) ×4 IMPLANT
SYR 10ML LL (SYRINGE) ×2 IMPLANT
SYR 30ML LL (SYRINGE) ×4 IMPLANT
TAPE TRANSPORE STRL 2 31045 (GAUZE/BANDAGES/DRESSINGS) ×2 IMPLANT
TIP FAN IRRIG PULSAVAC PLUS (DISPOSABLE) ×1 IMPLANT
TRAY TIBIAL OXFORD SZ D LF (Joint) ×2 IMPLANT
WATER STERILE IRR 1000ML POUR (IV SOLUTION) ×2 IMPLANT
WRAPON POLAR PAD KNEE (MISCELLANEOUS) ×2

## 2021-06-02 NOTE — Op Note (Signed)
06/02/2021  10:05 AM  Patient:   Antonio Villarreal.  Pre-Op Diagnosis:   Osteoarthritis of medial compartment, left knee.  Post-Op Diagnosis:   Same  Procedure:   Left unicondylar knee arthroplasty.  Surgeon:   Pascal Lux, MD  Assistant:   Cameron Proud, PA-C; Ardelle Park, PA-S  Anesthesia:   Spinal  Findings:   As above.  Complications:   None  EBL:   100 cc  Fluids:   1000 cc crystalloid  UOP:   None  TT:   8 minutes at 300 mmHg  Drains:   None  Closure:   Staples  Implants:   All-cemented Biomet Oxford system with a medium femoral component, a "D" sized tibial tray, and a 3 mm meniscal bearing insert.  Brief Clinical Note:   The patient is a 80 year old male with a history of progressively worsening medial sided left knee pain. His symptoms have progressed despite medications, activity modification, etc. His history and examination are consistent with advanced degenerative joint disease, primarily involving the medial compartment. The patient presents at this time for a left partial knee replacement.  Procedure:   The patient was brought into the operating room and a spinal placed by the anesthesiologist. The patient was lain in the supine position and repositioned so that the non-surgical leg was placed in a flexed and abducted position in the yellow fin leg holder while the surgical extremity was placed over the Biomet leg holder. The left lower extremity was prepped with ChloraPrep solution before being draped sterilely. Preoperative antibiotics were administered. After performing a timeout to verify the appropriate surgical site, the limb was exsanguinated with an Esmarch and the tourniquet inflated to 300 mmHg.   A standard anterior approach to the knee was made through an approximately 3.5-4 inch incision. The incision was carried down through the subcutaneous tissues to expose the superficial retinaculum. This was split the length the incision and the  medial flap elevated sufficiently to expose the medial retinaculum. The medial retinaculum was incised along the medial border of the patella tendon and extended proximally along the medial border of the patella, leaving a 3-4 mm cuff of tissue. The soft tissues were elevated off the anteromedial aspect of the proximal tibia. The anterior portion of the meniscus was removed after performing a subtotal excision of the infrapatellar fat pad. The anterior cruciate ligament was inspected and found to be in excellent condition. Osteophytes were removed from the inferior pole of the patella as well as from the notch using a quarter-inch osteotome. There were significant degenerative changes of both the femur and tibia on the medial side. Unfortunately, significant bleeding was encountered during the exposure. This was felt to be due to to his significant peripheral vascular disease causing the tourniquet to act as a venous tourniquet. Therefore, the tourniquet was let down at this point and pressure held over the wound for several minutes. The remainder the procedure was done without a tourniquet.  The medial femoral condyle was sized using the small and medium sizers. It was felt that the medium guide best optimized the contour of the femur. This was left in place and the external tibial guide positioned. The coupling device was used to connect the guide to the medial femoral condylar sizer to optimize appropriate orientation. Two guide pins were inserted into the cutting block before the coupling device and sizer were removed. The appropriate tibial cut was made using the oscillating and reciprocating saws. The piece was removed in  its entirety and taken to the back table where it was sized and found to be optimally replicated by a "D" sized component. The 8 mm spacer was inserted to verify that sufficient bone had been removed.  Attention was directed to femoral side. The intramedullary canal was accessed through a  4 mm drill hole. The intramedullary guide was positioned before the guide for the femoral condylar holes was positioned. The appropriate coupling device connected this guide to the intramedullary guide before both drill holes were created in the distal aspect of the medial femoral condyle. The devices were removed and the posterior condylar cutting block inserted. The appropriate cut was made using the reciprocating saw and this piece removed. The #0 spigot was inserted and the initial bone milling performed. A trial femoral component was inserted and both the flexion and extension gaps measured. In flexion, the gap measured 7 mm whereas in extension, it measured 1 mm. Therefore, the #6 spigot was selected and the secondary bone milling performed. Repeat sizing demonstrated symmetric flexion and extension gaps. The bone was removed from the postero-medial and postero-lateral aspects of the femoral condyle, as well as from the beneath the collar of the spigot. Bone also was removed from the anterior portion of the femur so as to minimize any potential impingement with the meniscal bearing insert. The trial components removed and several drill holes placed into the distal femoral condyle to further augment cement fixation.  Attention was redirected to the tibial side. The "D" sized tibial tray was positioned and temporarily secured using the appropriate spiked nail. The keel was created using the bi-bladed reciprocating saw and hoe. The keeled "D" sized trial tibial tray was inserted to be sure that it seated properly. At this point, a total of 20 cc of Exparel diluted out to 60 cc with normal saline and 30 cc of 0.5% Sensorcaine was injected in and around the posterior and medial capsular tissues, as well as the peri-incisional tissues to help with postoperative pain control.  The bony surfaces were prepared for cementing by irrigating them thoroughly with bacitracin saline solution using the jet lavage system  before packing them with a dry Ray-Tec sponge. Meanwhile, cement was being mixed on the back table. When the cement was ready, the tibial tray was cemented in first. The excess cement was removed using a Surveyor, quantity after impacting it into place. Next, the femoral component was impacted into place. Again the excess cement was removed using a Surveyor, quantity. The 4 mm spacer was inserted and the knee brought into near full extension while the cement hardened. Once the cement hardened, the spacer was removed and the 3 mm meniscal bearing insert was trialed. This demonstrated excellent tracking while the knee was placed through a range of motion, and showed no evidence towards subluxation or dislocation. In addition, it did not fit too tightly. Therefore, the permanent 3 mm meniscal bearing insert was snapped into position after verifying that no cement had been retained posteriorly. Again the knee was placed through a range of motion with the findings as described above.  The wound was copiously irrigated with sterile saline solution via the jet lavage system before the retinacular layer was reapproximated using #0 Vicryl interrupted sutures. At this point, 1 g of transexemic acid in 10 cc of normal saline was injected intra-articularly. The subcutaneous tissues were closed in two layers using 2-0 Vicryl interrupted sutures before the skin was closed using staples. A sterile occlusive dressing was applied to the  knee before the patient was awakened. The patient was transferred back to his/her hospital bed and returned to the recovery room in satisfactory condition after tolerating the procedure well. A Polar Care device was applied to the knee as well.
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
## 2021-06-02 NOTE — Discharge Instructions (Addendum)
Orthopedic discharge instructions: May shower with intact OpSite dressing. Apply ice frequently to knee or use Polar Care. Start Eliquis 1 tablet twice daily for 2 weeks on Wednesday, 06/03/2021, then take aspirin 325 mg daily for 4 weeks. Take oxycodone as prescribed when needed.  May supplement with ES Tylenol if necessary. May weight-bear as tolerated on left lower extremity - use walker for balance and support. Start home physical therapy tomorrow or Thursday as scheduled. Follow-up in 10-14 days or as scheduled.AMBULATORY SURGERY  DISCHARGE INSTRUCTIONS   The drugs that you were given will stay in your system until tomorrow so for the next 24 hours you should not:  Drive an automobile Make any legal decisions Drink any alcoholic beverage   You may resume regular meals tomorrow.  Today it is better to start with liquids and gradually work up to solid foods.  You may eat anything you prefer, but it is better to start with liquids, then soup and crackers, and gradually work up to solid foods.   Please notify your doctor immediately if you have any unusual bleeding, trouble breathing, redness and pain at the surgery site, drainage, fever, or pain not relieved by medication.    Additional Instructions:    Please contact your physician with any problems or Same Day Surgery at [phone], Monday through Friday 6 am to 4 pm, or [facility] number at [phone].

## 2021-06-02 NOTE — Anesthesia Postprocedure Evaluation (Signed)
Anesthesia Post Note  Patient: Antonio Behan Sr.  Procedure(s) Performed: UNICOMPARTMENTAL KNEE (Left: Knee)  Patient location during evaluation: PACU Anesthesia Type: MAC Level of consciousness: oriented and awake and alert Pain management: pain level controlled Vital Signs Assessment: post-procedure vital signs reviewed and stable Respiratory status: spontaneous breathing, respiratory function stable and patient connected to nasal cannula oxygen Cardiovascular status: blood pressure returned to baseline and stable Postop Assessment: no headache, no backache and no apparent nausea or vomiting Anesthetic complications: no   No notable events documented.   Last Vitals:  Vitals:   06/02/21 1030 06/02/21 1045  BP: (!) 95/54 (!) 113/53  Pulse: 71 71  Resp: 18 13  Temp:  (!) 36.3 C  SpO2: 95% 93%    Last Pain:  Vitals:   06/02/21 1045  TempSrc:   PainSc: 0-No pain                 Phill Mutter

## 2021-06-02 NOTE — Progress Notes (Signed)
Mr Radi has been up with physical therapy. Regional anesthesia completely resolved. Voiding without difficulty. Dressing D&I. Polar care in place. Minimal pain. Ready for discharge home.

## 2021-06-02 NOTE — Anesthesia Procedure Notes (Signed)
Procedure Name: MAC Date/Time: 06/02/2021 7:50 AM Performed by: Philbert Riser, CRNA Pre-anesthesia Checklist: Patient identified, Emergency Drugs available, Suction available, Patient being monitored and Timeout performed Patient Re-evaluated:Patient Re-evaluated prior to induction Oxygen Delivery Method: Simple face mask

## 2021-06-02 NOTE — Anesthesia Procedure Notes (Addendum)
Spinal  Patient location during procedure: OR Start time: 06/02/2021 7:35 AM End time: 06/02/2021 7:44 AM Reason for block: surgical anesthesia Staffing Performed: anesthesiologist  Anesthesiologist: Phill Mutter, MD Preanesthetic Checklist Completed: patient identified, IV checked, site marked, risks and benefits discussed, surgical consent, monitors and equipment checked, pre-op evaluation and timeout performed Spinal Block Patient position: sitting Prep: DuraPrep Patient monitoring: heart rate, cardiac monitor, continuous pulse ox and blood pressure Approach: midline Location: L3-4 Injection technique: single-shot Needle Needle type: Pencan  Needle gauge: 24 G Needle length: 9 cm Assessment Sensory level: T4 Events: CSF return

## 2021-06-02 NOTE — Transfer of Care (Signed)
Immediate Anesthesia Transfer of Care Note  Patient: Antonio Favor Sr.  Procedure(s) Performed: UNICOMPARTMENTAL KNEE (Left: Knee)  Patient Location: PACU  Anesthesia Type:MAC and Spinal  Level of Consciousness: awake  Airway & Oxygen Therapy: Patient Spontanous Breathing and Patient connected to face mask oxygen  Post-op Assessment: Report given to RN and Post -op Vital signs reviewed and stable  Post vital signs: Reviewed and stable  Last Vitals:  Vitals Value Taken Time  BP 90/53 06/02/21 1016  Temp    Pulse 73 06/02/21 1020  Resp 21 06/02/21 1020  SpO2 99 % 06/02/21 1020  Vitals shown include unvalidated device data.  Last Pain:  Vitals:   06/02/21 0627  TempSrc: Oral  PainSc: 4          Complications: No notable events documented.

## 2021-06-02 NOTE — Evaluation (Signed)
Physical Therapy Evaluation Patient Details Name: Antonio Villarreal. MRN: [id number] DOB: [birth year] Today's Date: 06/02/2021   History of Present Illness  Patient is an 80 yo male s/p L unicompartmental knee replacement. PMH of HTN, prior smoker, CABG, DM, infrarenal abdominal aorta.   Clinical Impression  Patient alert, agreeable to PT. Reported at baseline he is independent, lives with his wife (who is at rehab at the moment). His daughter flew in and plans on staying with him for the next week.   The patient was able to perform exercises with verbal cueing, good control of SLR noted. Pt with noticeable squelching in knee due to fluid, MD notified. He was able to perform supine to sit with supervision, and sit <> stand with CGA and RW and then supervision after education. He ambulated ~156ft with RW and CGA, step through gait pattern performed, educated on step to gait as needed for pain management. Stair navigation performed, pt verbalized and demonstrated understanding.  Overall the patient demonstrated deficits (see "PT Problem List") that impede the patient's functional abilities, safety, and mobility and would benefit from skilled PT intervention. Discharge recommendation is to follow surgeon's POC.     Follow Up Recommendations Follow surgeon's recommendation for DC plan and follow-up therapies;Outpatient PT    Equipment Recommendations  None recommended by PT    Recommendations for Other Services       Precautions / Restrictions Precautions Precautions: Fall;Knee Precaution Booklet Issued: Yes (comment) Restrictions Weight Bearing Restrictions: Yes LLE Weight Bearing: Weight bearing as tolerated      Mobility  Bed Mobility Overal bed mobility: Needs Assistance Bed Mobility: Supine to Sit     Supine to sit: Supervision          Transfers Overall transfer level: Needs assistance Equipment used: Rolling walker (2 wheeled) Transfers: Sit to/from Stand Sit to  Stand: Supervision;Min guard         General transfer comment: cued for hand placement with good carryover  Ambulation/Gait   Gait Distance (Feet): 100 Feet Assistive device: Rolling walker (2 wheeled)   Gait velocity: decreased      Stairs Stairs: Yes Stairs assistance: Min guard Stair Management: Two rails;Step to pattern Number of Stairs: 4 General stair comments: steady, safe  Wheelchair Mobility    Modified Rankin (Stroke Patients Only)       Balance Overall balance assessment: Needs assistance Sitting-balance support: Feet supported Sitting balance-Leahy Scale: Good       Standing balance-Leahy Scale: Fair Standing balance comment: UE support for all dynamic tasks/ambulation                             Pertinent Vitals/Pain Pain Assessment: 0-10 Pain Score: 2  Pain Location: L knee Pain Descriptors / Indicators: Sore Pain Intervention(s): Limited activity within patient's tolerance;Monitored during session;Repositioned;Premedicated before session    Home Living Family/patient expects to be discharged to:: Private residence Living Arrangements: Alone;Other (Comment) (daughter is staying with him for a week) Available Help at Discharge: Family Type of Home: House Home Access: Stairs to enter Entrance Stairs-Rails: Left;Right;Can reach both Entrance Stairs-Number of Steps: 3+1 Home Layout: One level Home Equipment: Shower seat - built in;Walker - 2 wheels;Toilet riser;Grab bars - toilet;Grab bars - tub/shower;Hand held shower head;Cane - single point;Crutches      Prior Function Level of Independence: Independent               Hand Dominance   Dominant Hand:  Left    Extremity/Trunk Assessment   Upper Extremity Assessment Upper Extremity Assessment: Overall WFL for tasks assessed    Lower Extremity Assessment Lower Extremity Assessment: LLE deficits/detail;RLE deficits/detail RLE Deficits / Details: WFLs for tasks LLE  Deficits / Details: s/p L uni TKA       Communication   Communication: No difficulties  Cognition Arousal/Alertness: Awake/alert Behavior During Therapy: WFL for tasks assessed/performed Overall Cognitive Status: Within Functional Limits for tasks assessed                                        General Comments      Exercises Total Joint Exercises Ankle Circles/Pumps: AROM;Both;10 reps Quad Sets: AROM;Left;10 reps Heel Slides: AROM;Left;10 reps Straight Leg Raises: AROM;Left (1 rep)   Assessment/Plan    PT Assessment Patient needs continued PT services  PT Problem List Decreased strength;Decreased range of motion;Decreased knowledge of use of DME;Decreased activity tolerance;Decreased balance;Pain;Decreased mobility;Decreased knowledge of precautions       PT Treatment Interventions DME instruction;Balance training;Gait training;Neuromuscular re-education;Stair training;Functional mobility training;Patient/family education;Therapeutic activities;Therapeutic exercise    PT Goals (Current goals can be found in the Care Plan section)  Acute Rehab PT Goals Patient Stated Goal: to go home PT Goal Formulation: With patient Time For Goal Achievement: 06/16/21 Potential to Achieve Goals: Good    Frequency BID   Barriers to discharge        Co-evaluation               AM-PAC PT "6 Clicks" Mobility  Outcome Measure Help needed turning from your back to your side while in a flat bed without using bedrails?: None Help needed moving from lying on your back to sitting on the side of a flat bed without using bedrails?: None Help needed moving to and from a bed to a chair (including a wheelchair)?: None Help needed standing up from a chair using your arms (e.g., wheelchair or bedside chair)?: None Help needed to walk in hospital room?: A Little Help needed climbing 3-5 steps with a railing? : A Little 6 Click Score: 22    End of Session Equipment  Utilized During Treatment: Gait belt Activity Tolerance: Patient tolerated treatment well Patient left: in chair;with call bell/phone within reach;with family/visitor present Nurse Communication: Mobility status PT Visit Diagnosis: Muscle weakness (generalized) (M62.81);Pain;Difficulty in walking, not elsewhere classified (R26.2) Pain - Right/Left: Left Pain - part of body: Knee    Time: 7616-0737 PT Time Calculation (min) (ACUTE ONLY): 53 min   Charges:   PT Evaluation $PT Eval Low Complexity: 1 Low PT Treatments $Gait Training: 23-37 mins $Therapeutic Exercise: 8-22 mins        Lieutenant Diego PT, DPT 3:24 PM,06/02/21

## 2021-06-03 ENCOUNTER — Encounter: Payer: Self-pay | Admitting: Surgery

## 2021-06-03 DIAGNOSIS — Z96652 Presence of left artificial knee joint: Secondary | ICD-10-CM | POA: Insufficient documentation

## 2021-06-04 ENCOUNTER — Encounter: Payer: Self-pay | Admitting: Surgery

## 2021-06-09 ENCOUNTER — Ambulatory Visit: Payer: HMO | Admitting: Pain Medicine

## 2021-06-13 DIAGNOSIS — M1711 Unilateral primary osteoarthritis, right knee: Secondary | ICD-10-CM | POA: Diagnosis not present

## 2021-06-13 DIAGNOSIS — I10 Essential (primary) hypertension: Secondary | ICD-10-CM | POA: Diagnosis not present

## 2021-06-13 DIAGNOSIS — E119 Type 2 diabetes mellitus without complications: Secondary | ICD-10-CM | POA: Diagnosis not present

## 2021-06-13 DIAGNOSIS — Z7901 Long term (current) use of anticoagulants: Secondary | ICD-10-CM | POA: Diagnosis not present

## 2021-06-13 DIAGNOSIS — E785 Hyperlipidemia, unspecified: Secondary | ICD-10-CM | POA: Diagnosis not present

## 2021-06-13 DIAGNOSIS — Z96652 Presence of left artificial knee joint: Secondary | ICD-10-CM | POA: Diagnosis not present

## 2021-06-13 DIAGNOSIS — I2510 Atherosclerotic heart disease of native coronary artery without angina pectoris: Secondary | ICD-10-CM | POA: Diagnosis not present

## 2021-06-13 DIAGNOSIS — Z794 Long term (current) use of insulin: Secondary | ICD-10-CM | POA: Diagnosis not present

## 2021-06-13 DIAGNOSIS — Z9181 History of falling: Secondary | ICD-10-CM | POA: Diagnosis not present

## 2021-06-13 DIAGNOSIS — Z471 Aftercare following joint replacement surgery: Secondary | ICD-10-CM | POA: Diagnosis not present

## 2021-06-13 DIAGNOSIS — N400 Enlarged prostate without lower urinary tract symptoms: Secondary | ICD-10-CM | POA: Diagnosis not present

## 2021-06-13 DIAGNOSIS — F32A Depression, unspecified: Secondary | ICD-10-CM | POA: Diagnosis not present

## 2021-06-13 DIAGNOSIS — Z87891 Personal history of nicotine dependence: Secondary | ICD-10-CM | POA: Diagnosis not present

## 2021-06-16 DIAGNOSIS — N400 Enlarged prostate without lower urinary tract symptoms: Secondary | ICD-10-CM | POA: Diagnosis not present

## 2021-06-16 DIAGNOSIS — I2510 Atherosclerotic heart disease of native coronary artery without angina pectoris: Secondary | ICD-10-CM | POA: Diagnosis not present

## 2021-06-16 DIAGNOSIS — Z794 Long term (current) use of insulin: Secondary | ICD-10-CM | POA: Diagnosis not present

## 2021-06-16 DIAGNOSIS — E785 Hyperlipidemia, unspecified: Secondary | ICD-10-CM | POA: Diagnosis not present

## 2021-06-16 DIAGNOSIS — E119 Type 2 diabetes mellitus without complications: Secondary | ICD-10-CM | POA: Diagnosis not present

## 2021-06-16 DIAGNOSIS — M1711 Unilateral primary osteoarthritis, right knee: Secondary | ICD-10-CM | POA: Diagnosis not present

## 2021-06-16 DIAGNOSIS — Z9181 History of falling: Secondary | ICD-10-CM | POA: Diagnosis not present

## 2021-06-16 DIAGNOSIS — Z7901 Long term (current) use of anticoagulants: Secondary | ICD-10-CM | POA: Diagnosis not present

## 2021-06-16 DIAGNOSIS — Z96652 Presence of left artificial knee joint: Secondary | ICD-10-CM | POA: Diagnosis not present

## 2021-06-16 DIAGNOSIS — Z87891 Personal history of nicotine dependence: Secondary | ICD-10-CM | POA: Diagnosis not present

## 2021-06-16 DIAGNOSIS — F32A Depression, unspecified: Secondary | ICD-10-CM | POA: Diagnosis not present

## 2021-06-16 DIAGNOSIS — I10 Essential (primary) hypertension: Secondary | ICD-10-CM | POA: Diagnosis not present

## 2021-06-16 DIAGNOSIS — Z471 Aftercare following joint replacement surgery: Secondary | ICD-10-CM | POA: Diagnosis not present

## 2021-06-29 NOTE — Progress Notes (Signed)
PROVIDER NOTE: Information contained herein reflects review and annotations entered in association with encounter. Interpretation of such information and data should be left to medically-trained personnel. Information provided to patient can be located elsewhere in the medical record under "Patient Instructions". Document created using STT-dictation technology, any transcriptional errors that may result from process are unintentional.    Patient: Antonio Favor Sr.  Service Category: Procedure  Provider: Gaspar Cola, MD  DOB: 05-24-1941  DOS: 07/02/2021  Location: [facility]  MRN: [id number]  Setting: Ambulatory - outpatient  Referring Provider: Sofie Hartigan, MD  Type: Established Patient  Specialty: Interventional Pain Management  PCP: Antonio Hartigan, MD   Primary Reason for Visit: Interventional Pain Management Treatment. CC: Back Pain (bilateral)  Procedure:          Anesthesia, Analgesia, Anxiolysis:  Type: Lumbar Facet, Medial Branch Block(s)          Primary Purpose: Diagnostic Region: Posterolateral Lumbosacral Spine Level: L2, L3, L4, L5, & S1 Medial Branch Level(s). Injecting these levels blocks the L3-4, L4-5, and L5-S1 lumbar facet joints. Laterality: Bilateral  Type: Local Anesthesia Indication(s): Analgesia         Route: Infiltration ([place]/IM) IV Access: Declined Sedation: Declined  Local Anesthetic: Lidocaine 1-2%  Position: Prone   Indications: 1. Lumbar facet joint syndrome (Bilateral)   2. DDD (degenerative disc disease), lumbosacral   3. Spondylosis without myelopathy or radiculopathy, lumbosacral region   4. Displacement of lumbar intervertebral disc (Multilevel)   5. Lumbar facet hypertrophy (Multilevel) (Bilateral)   6. Lumbosacral Retrolisthesis (L1-2, L2-3, L3-4, and L5-S1   7. Chronic low back pain (1ry area of Pain) (Bilateral) w/o sciatica    Pain Score: Pre-procedure: 4 /10 Post-procedure: 0-No pain/10   Pre-op H&P  Assessment:  Antonio Villarreal is a 80 y.o. (year old), male patient, seen today for interventional treatment. He  has a past surgical history that includes Hip surgery (Right, 1989); Ankle surgery; Tonsillectomy; Cardiac catheterization; Coronary artery bypass graft (09/18/2012); TEE without cardioversion (09/18/2012); Cataract extraction w/PHACO (Left, 01/17/2017); RIGHT/LEFT HEART CATH AND CORONARY/GRAFT ANGIOGRAPHY (N/A, 02/01/2019); Cataract extraction w/PHACO (Right, 09/25/2019); Cardiac valve replacement (03/13/2019); Hernia repair; Eye surgery; Trigger finger release (Right, 04/15/2021); and Partial knee arthroplasty (Left, 06/02/2021). Antonio Villarreal has a current medication list which includes the following prescription(s): acetaminophen, amlodipine, apixaban, brinzolamide, insulin glargine, latanoprost, lisinopril, novolog flexpen, oxycodone, and rosuvastatin, and the following Facility-Administered Medications: pentafluoroprop-tetrafluoroeth. His primarily concern today is the Back Pain (bilateral)  Initial Vital Signs:  Pulse/HCG Rate: 65  Temp: (!) 97.1 F (36.2 C) Resp: 16 BP: 135/66 SpO2: 96 %  BMI: Estimated body mass index is 27.41 kg/m as calculated from the following:   Height as of this encounter: '5\' 10"'$  (1.778 m).   Weight as of this encounter: 191 lb (86.6 kg).  Risk Assessment: Allergies: Reviewed. He is allergic to alphagan [brimonidine], januvia [sitagliptin], morphine and related, and timolol.  Allergy Precautions: None required Coagulopathies: Reviewed. None identified.  Blood-thinner therapy: None at this time Active Infection(s): Reviewed. None identified. Antonio Villarreal is afebrile  Site Confirmation: Antonio Villarreal was asked to confirm the procedure and laterality before marking the site Procedure checklist: Completed Consent: Before the procedure and under the influence of no sedative(s), amnesic(s), or anxiolytics, the patient was informed of the treatment options, risks and possible  complications. To fulfill our ethical and legal obligations, as recommended by the American Medical Association's Code of Ethics, I have informed the patient of my clinical impression;  the nature and purpose of the treatment or procedure; the risks, benefits, and possible complications of the intervention; the alternatives, including doing nothing; the risk(s) and benefit(s) of the alternative treatment(s) or procedure(s); and the risk(s) and benefit(s) of doing nothing. The patient was provided information about the general risks and possible complications associated with the procedure. These may include, but are not limited to: failure to achieve desired goals, infection, bleeding, organ or nerve damage, allergic reactions, paralysis, and death. In addition, the patient was informed of those risks and complications associated to Spine-related procedures, such as failure to decrease pain; infection (i.e.: Meningitis, epidural or intraspinal abscess); bleeding (i.e.: epidural hematoma, subarachnoid hemorrhage, or any other type of intraspinal or peri-dural bleeding); organ or nerve damage (i.e.: Any type of peripheral nerve, nerve root, or spinal cord injury) with subsequent damage to sensory, motor, and/or autonomic systems, resulting in permanent pain, numbness, and/or weakness of one or several areas of the body; allergic reactions; (i.e.: anaphylactic reaction); and/or death. Furthermore, the patient was informed of those risks and complications associated with the medications. These include, but are not limited to: allergic reactions (i.e.: anaphylactic or anaphylactoid reaction(s)); adrenal axis suppression; blood sugar elevation that in diabetics may result in ketoacidosis or comma; water retention that in patients with history of congestive heart failure may result in shortness of breath, pulmonary edema, and decompensation with resultant heart failure; weight gain; swelling or edema; medication-induced  neural toxicity; particulate matter embolism and blood vessel occlusion with resultant organ, and/or nervous system infarction; and/or aseptic necrosis of one or more joints. Finally, the patient was informed that Medicine is not an exact science; therefore, there is also the possibility of unforeseen or unpredictable risks and/or possible complications that may result in a catastrophic outcome. The patient indicated having understood very clearly. We have given the patient no guarantees and we have made no promises. Enough time was given to the patient to ask questions, all of which were answered to the patient's satisfaction. Mr. Yadav has indicated that he wanted to continue with the procedure. Attestation: I, the ordering provider, attest that I have discussed with the patient the benefits, risks, side-effects, alternatives, likelihood of achieving goals, and potential problems during recovery for the procedure that I have provided informed consent. Date  Time: 07/02/2021  9:51 AM  Pre-Procedure Preparation:  Monitoring: As per clinic protocol. Respiration, ETCO2, SpO2, BP, heart rate and rhythm monitor placed and checked for adequate function Safety Precautions: Patient was assessed for positional comfort and pressure points before starting the procedure. Time-out: I initiated and conducted the "Time-out" before starting the procedure, as per protocol. The patient was asked to participate by confirming the accuracy of the "Time Out" information. Verification of the correct person, site, and procedure were performed and confirmed by me, the nursing staff, and the patient. "Time-out" conducted as per Joint Commission's Universal Protocol (UP.01.01.01). Time: 1107  Description of Procedure:          Laterality: Bilateral. The procedure was performed in identical fashion on both sides. Levels:  L2, L3, L4, L5, & S1 Medial Branch Level(s) Area Prepped: Posterior Lumbosacral Region DuraPrep (Iodine  Povacrylex [0.7% available iodine] and Isopropyl Alcohol, 74% w/w) Safety Precautions: Aspiration looking for blood return was conducted prior to all injections. At no point did we inject any substances, as a needle was being advanced. Before injecting, the patient was told to immediately notify me if he was experiencing any new onset of "ringing in the ears, or metallic  taste in the mouth". No attempts were made at seeking any paresthesias. Safe injection practices and needle disposal techniques used. Medications properly checked for expiration dates. SDV (single dose vial) medications used. After the completion of the procedure, all disposable equipment used was discarded in the proper designated medical waste containers. Local Anesthesia: Protocol guidelines were followed. The patient was positioned over the fluoroscopy table. The area was prepped in the usual manner. The time-out was completed. The target area was identified using fluoroscopy. A 12-in long, straight, sterile hemostat was used with fluoroscopic guidance to locate the targets for each level blocked. Once located, the skin was marked with an approved surgical skin marker. Once all sites were marked, the skin (epidermis, dermis, and hypodermis), as well as deeper tissues (fat, connective tissue and muscle) were infiltrated with a small amount of a short-acting local anesthetic, loaded on a 10cc syringe with a 25G, 1.5-in  Needle. An appropriate amount of time was allowed for local anesthetics to take effect before proceeding to the next step. Local Anesthetic: Lidocaine 2.0% The unused portion of the local anesthetic was discarded in the proper designated containers. Technical explanation of process:  L2 Medial Branch Nerve Block (MBB): The target area for the L2 medial branch is at the junction of the postero-lateral aspect of the superior articular process and the superior, posterior, and medial edge of the transverse process of L3. Under  fluoroscopic guidance, a Quincke needle was inserted until contact was made with os over the superior postero-lateral aspect of the pedicular shadow (target area). After negative aspiration for blood, 0.5 mL of the nerve block solution was injected without difficulty or complication. The needle was removed intact. L3 Medial Branch Nerve Block (MBB): The target area for the L3 medial branch is at the junction of the postero-lateral aspect of the superior articular process and the superior, posterior, and medial edge of the transverse process of L4. Under fluoroscopic guidance, a Quincke needle was inserted until contact was made with os over the superior postero-lateral aspect of the pedicular shadow (target area). After negative aspiration for blood, 0.5 mL of the nerve block solution was injected without difficulty or complication. The needle was removed intact. L4 Medial Branch Nerve Block (MBB): The target area for the L4 medial branch is at the junction of the postero-lateral aspect of the superior articular process and the superior, posterior, and medial edge of the transverse process of L5. Under fluoroscopic guidance, a Quincke needle was inserted until contact was made with os over the superior postero-lateral aspect of the pedicular shadow (target area). After negative aspiration for blood, 0.5 mL of the nerve block solution was injected without difficulty or complication. The needle was removed intact. L5 Medial Branch Nerve Block (MBB): The target area for the L5 medial branch is at the junction of the postero-lateral aspect of the superior articular process and the superior, posterior, and medial edge of the sacral ala. Under fluoroscopic guidance, a Quincke needle was inserted until contact was made with os over the superior postero-lateral aspect of the pedicular shadow (target area). After negative aspiration for blood, 0.5 mL of the nerve block solution was injected without difficulty or  complication. The needle was removed intact. S1 Medial Branch Nerve Block (MBB): The target area for the S1 medial branch is at the posterior and inferior 6 o'clock position of the L5-S1 facet joint. Under fluoroscopic guidance, the Quincke needle inserted for the L5 MBB was redirected until contact was made with os over  the inferior and postero aspect of the sacrum, at the 6 o' clock position under the L5-S1 facet joint (Target area). After negative aspiration for blood, 0.5 mL of the nerve block solution was injected without difficulty or complication. The needle was removed intact.  Nerve block solution: 0.2% PF-Ropivacaine + Triamcinolone (40 mg/mL) diluted to a final concentration of 4 mg of Triamcinolone/mL of Ropivacaine The unused portion of the solution was discarded in the proper designated containers. Procedural Needles: 22-gauge, 3.5-inch, Quincke needles used for all levels.  Once the entire procedure was completed, the treated area was cleaned, making sure to leave some of the prepping solution back to take advantage of its long term bactericidal properties.      Illustration of the posterior view of the lumbar spine and the posterior neural structures. Laminae of L2 through S1 are labeled. DPRL5, dorsal primary ramus of L5; DPRS1, dorsal primary ramus of S1; DPR3, dorsal primary ramus of L3; FJ, facet (zygapophyseal) joint L3-L4; I, inferior articular process of L4; LB1, lateral branch of dorsal primary ramus of L1; IAB, inferior articular branches from L3 medial branch (supplies L4-L5 facet joint); IBP, intermediate branch plexus; MB3, medial branch of dorsal primary ramus of L3; NR3, third lumbar nerve root; S, superior articular process of L5; SAB, superior articular branches from L4 (supplies L4-5 facet joint also); TP3, transverse process of L3.  Vitals:   07/02/21 0950 07/02/21 1105 07/02/21 1115  BP: 135/66 (!) 166/94 (!) 188/98  Pulse: 65 79 81  Resp: '16 14 16  '$ Temp: (!)  97.1 F (36.2 C)    TempSrc: Temporal    SpO2: 96% 99% 100%  Weight: 191 lb (86.6 kg)    Height: '5\' 10"'$  (1.778 m)       Start Time: 1107 hrs. End Time: 1113 hrs.  Imaging Guidance (Spinal):          Type of Imaging Technique: Fluoroscopy Guidance (Spinal) Indication(s): Assistance in needle guidance and placement for procedures requiring needle placement in or near specific anatomical locations not easily accessible without such assistance. Exposure Time: Please see nurses notes. Contrast: None used. Fluoroscopic Guidance: I was personally present during the use of fluoroscopy. "Tunnel Vision Technique" used to obtain the best possible view of the target area. Parallax error corrected before commencing the procedure. "Direction-depth-direction" technique used to introduce the needle under continuous pulsed fluoroscopy. Once target was reached, antero-posterior, oblique, and lateral fluoroscopic projection used confirm needle placement in all planes. Images permanently stored in EMR. Interpretation: No contrast injected. I personally interpreted the imaging intraoperatively. Adequate needle placement confirmed in multiple planes. Permanent images saved into the patient's record.  Antibiotic Prophylaxis:   Anti-infectives (From admission, onward)    None      Indication(s): None identified  Post-operative Assessment:  Post-procedure Vital Signs:  Pulse/HCG Rate: 81 (nsr)  Temp: (!) 97.1 F (36.2 C) Resp: 16 BP: (!) 188/98 SpO2: 100 %  EBL: None  Complications: No immediate post-treatment complications observed by team, or reported by patient.  Note: The patient tolerated the entire procedure well. A repeat set of vitals were taken after the procedure and the patient was kept under observation following institutional policy, for this type of procedure. Post-procedural neurological assessment was performed, showing return to baseline, prior to discharge. The patient was provided  with post-procedure discharge instructions, including a section on how to identify potential problems. Should any problems arise concerning this procedure, the patient was given instructions to immediately contact us, at any time,  without hesitation. In any case, we plan to contact the patient by telephone for a follow-up status report regarding this interventional procedure.  Comments:  No additional relevant information.  Plan of Care  Orders:  Orders Placed This Encounter  Procedures   LUMBAR FACET(MEDIAL BRANCH NERVE BLOCK) MBNB    Scheduling Instructions:     Procedure: Lumbar facet block (AKA.: Lumbosacral medial branch nerve block)     Side: Bilateral     Level: L3-4, L4-5, & L5-S1 Facets (L2, L3, L4, L5, & S1 Medial Branch Nerves)     Sedation: Patient's choice.     Timeframe: Today    Order Specific Question:   Where will this procedure be performed?    Answer:   [facility]   DG PAIN [facility] C-ARM 1-60 MIN NO REPORT    Intraoperative interpretation by procedural physician at [place].    Standing Status:   Standing    Number of Occurrences:   1    Order Specific Question:   Reason for exam:    Answer:   Assistance in needle guidance and placement for procedures requiring needle placement in or near specific anatomical locations not easily accessible without such assistance.   Informed Consent Details: Physician/Practitioner Attestation; Transcribe to consent form and obtain patient signature    Nursing Order: Transcribe to consent form and obtain patient signature. Note: Always confirm laterality of pain with Mr. Ramires, before procedure.    Order Specific Question:   Physician/Practitioner attestation of informed consent for procedure/surgical case    Answer:   I, the physician/practitioner, attest that I have discussed with the patient the benefits, risks, side effects, alternatives, likelihood of achieving goals and potential problems during recovery for  the procedure that I have provided informed consent.    Order Specific Question:   Procedure    Answer:   Lumbar Facet Block  under fluoroscopic guidance    Order Specific Question:   Physician/Practitioner performing the procedure    Answer:   Shantai Tiedeman A. Dossie Arbour MD    Order Specific Question:   Indication/Reason    Answer:   Low Back Pain, with our without leg pain, due to Facet Joint Arthralgia (Joint Pain) Spondylosis (Arthritis of the Spine), without myelopathy or radiculopathy (Nerve Damage).   Provide equipment / supplies at bedside    "Block Tray" (Disposable  single use) Needle type: SpinalSpinal Amount/quantity: 4 Size: Regular (3.5-inch) Gauge: 22G    Standing Status:   Standing    Number of Occurrences:   1    Order Specific Question:   Specify    Answer:   Block Tray    Chronic Opioid Analgesic:  No chronic opioid analgesics therapy prescribed by our practice. Tramadol 50 mg tablet, 1 tab p.o. 5 times a day (04/16/2021) MME/day: 25 mg/day   Medications ordered for procedure: Meds ordered this encounter  Medications   lidocaine (XYLOCAINE) 2 % (with pres) injection 400 mg   ropivacaine (PF) 2 mg/mL (0.2%) (NAROPIN) injection 18 mL   triamcinolone acetonide (KENALOG-40) injection 80 mg   pentafluoroprop-tetrafluoroeth (GEBAUERS) aerosol    Medications administered: We administered lidocaine, ropivacaine (PF) 2 mg/mL (0.2%), and triamcinolone acetonide.  See the medical record for exact dosing, route, and time of administration.  Follow-up plan:   Return in about 2 weeks (around 07/16/2021) for (T,Th) (59F) (PPE).       Interventional Therapies  Risk  Complexity Considerations:   Estimated body mass index is 27.41 kg/m as calculated from  the following:   Height as of this encounter: '5\' 10"'$  (1.778 m).   Weight as of this encounter: 191 lb (86.6 kg). ALLERGY: (Morphine & related)   Planned  Pending:   Diagnostic bilateral lumbar facet block #1 (07/02/2021)     Under consideration:   Diagnostic bilateral lumbar facet block #1    Completed:   Therapeutic right L4-5 LESI x3 (11/06/2013 through 02/28/2014)  Therapeutic right IA steroid knee injection x1 (06/17/2014)    Therapeutic  Palliative (PRN) options:   None established    Recent Visits Date Type Provider Dept  05/20/21 Office Visit Milinda Pointer, MD [facility]  Showing recent visits within past 90 days and meeting all other requirements Today's Visits Date Type Provider Dept  07/02/21 Procedure visit Milinda Pointer, MD [facility]  Showing today's visits and meeting all other requirements Future Appointments Date Type Provider Dept  07/14/21 Appointment Milinda Pointer, MD [facility]  Showing future appointments within next 90 days and meeting all other requirements Disposition: Discharge home  Discharge (Date  Time): 07/02/2021; 1119 hrs.   Primary Care Physician: Antonio Hartigan, MD Location: [facility] Note by: Antonio Cola, MD Date: 07/02/2021; Time: 12:30 PM  Disclaimer:  Medicine is not an Chief Strategy Officer. The only guarantee in medicine is that nothing is guaranteed. It is important to note that the decision to proceed with this intervention was based on the information collected from the patient. The Data and conclusions were drawn from the patient's questionnaire, the interview, and the physical examination. Because the information was provided in large part by the patient, it cannot be guaranteed that it has not been purposely or unconsciously manipulated. Every effort has been made to obtain as much relevant data as possible for this evaluation. It is important to note that the conclusions that lead to this procedure are derived in large part from the available data. Always take into account that the treatment will also be dependent on availability of resources and existing treatment guidelines,  considered by other Pain Management Practitioners as being common knowledge and practice, at the time of the intervention. For Medico-Legal purposes, it is also important to point out that variation in procedural techniques and pharmacological choices are the acceptable norm. The indications, contraindications, technique, and results of the above procedure should only be interpreted and judged by a Board-Certified Interventional Pain Specialist with extensive familiarity and expertise in the same exact procedure and technique.

## 2021-07-01 DIAGNOSIS — M47817 Spondylosis without myelopathy or radiculopathy, lumbosacral region: Secondary | ICD-10-CM | POA: Insufficient documentation

## 2021-07-02 ENCOUNTER — Other Ambulatory Visit: Payer: Self-pay

## 2021-07-02 ENCOUNTER — Ambulatory Visit (HOSPITAL_BASED_OUTPATIENT_CLINIC_OR_DEPARTMENT_OTHER): Payer: HMO | Admitting: Pain Medicine

## 2021-07-02 ENCOUNTER — Encounter: Payer: Self-pay | Admitting: Pain Medicine

## 2021-07-02 ENCOUNTER — Ambulatory Visit
Admission: RE | Admit: 2021-07-02 | Discharge: 2021-07-02 | Disposition: A | Discharge status: Home / Self Care | Payer: HMO | Source: Ambulatory Visit | Attending: Pain Medicine | Admitting: Pain Medicine

## 2021-07-02 VITALS — BP 188/98 | HR 81 | Temp 97.1°F | Resp 16 | Ht 70.0 in | Wt 191.0 lb

## 2021-07-02 DIAGNOSIS — M5137 Other intervertebral disc degeneration, lumbosacral region: Secondary | ICD-10-CM

## 2021-07-02 DIAGNOSIS — G8929 Other chronic pain: Secondary | ICD-10-CM

## 2021-07-02 DIAGNOSIS — M545 Low back pain, unspecified: Secondary | ICD-10-CM | POA: Insufficient documentation

## 2021-07-02 DIAGNOSIS — M5126 Other intervertebral disc displacement, lumbar region: Secondary | ICD-10-CM | POA: Diagnosis not present

## 2021-07-02 DIAGNOSIS — M47816 Spondylosis without myelopathy or radiculopathy, lumbar region: Secondary | ICD-10-CM | POA: Insufficient documentation

## 2021-07-02 DIAGNOSIS — M4310 Spondylolisthesis, site unspecified: Secondary | ICD-10-CM | POA: Insufficient documentation

## 2021-07-02 DIAGNOSIS — M47817 Spondylosis without myelopathy or radiculopathy, lumbosacral region: Secondary | ICD-10-CM | POA: Insufficient documentation

## 2021-07-02 MED ORDER — TRIAMCINOLONE ACETONIDE 40 MG/ML IJ SUSP
80.0000 mg | Freq: Once | INTRAMUSCULAR | Status: AC
  Administered 2021-07-02: 80 mg
  Filled 2021-07-02: qty 2

## 2021-07-02 MED ORDER — LIDOCAINE HCL (PF) 2 % IJ SOLN
INTRAMUSCULAR | Status: AC
  Filled 2021-07-02: qty 5

## 2021-07-02 MED ORDER — ROPIVACAINE HCL 2 MG/ML IJ SOLN
18.0000 mL | Freq: Once | INTRAMUSCULAR | Status: AC
  Administered 2021-07-02: 18 mL via PERINEURAL

## 2021-07-02 MED ORDER — LIDOCAINE HCL 2 % IJ SOLN
20.0000 mL | Freq: Once | INTRAMUSCULAR | Status: AC
  Administered 2021-07-02: 400 mg

## 2021-07-02 MED ORDER — PENTAFLUOROPROP-TETRAFLUOROETH EX AERO
INHALATION_SPRAY | Freq: Once | CUTANEOUS | Status: DC
  Filled 2021-07-02: qty 116

## 2021-07-02 NOTE — Progress Notes (Signed)
Safety precautions to be maintained throughout the outpatient stay will include: orient to surroundings, keep bed in low position, maintain call bell within reach at all times, provide assistance with transfer out of bed and ambulation.  

## 2021-07-02 NOTE — Progress Notes (Signed)
PROVIDER NOTE: Information contained herein reflects review and annotations entered in association with encounter. Interpretation of such information and data should be left to medically-trained personnel. Information provided to patient can be located elsewhere in the medical record under "Patient Instructions". Document created using STT-dictation technology, any transcriptional errors that may result from process are unintentional.    Patient: Antonio Favor Sr.  Service Category: E/M  Provider: Gaspar Cola, MD  DOB: [birth year]  DOS: 07/02/2021  Specialty: Interventional Pain Management  MRN: [id number]  Setting: Ambulatory outpatient  PCP: Sofie Hartigan, MD  Type: Established Patient    Referring Provider: Sofie Hartigan, MD  Location: Office  Delivery: Face-to-face     Primary Reason(s) for Visit: Encounter for evaluation before starting new chronic pain management plan of care (Level of risk: moderate) CC: Back Pain (bilateral)  HPI  Antonio Villarreal is a 80 y.o. year old, male patient, who comes today for a follow-up evaluation to review the test results and decide on a treatment plan. He has Fatigue; Mixed hyperlipidemia; Diabetes mellitus, type 2 ([place]); Dyspnea on exertion; BPH (benign prostatic hyperplasia); S/P CABG x 4; Sepsis ([place]); Secondary pneumonia; Influenza A; Stable angina ([place]); AAA (abdominal aortic aneurysm) without rupture ([place]); Aftercare following right ankle joint replacement surgery; Arthritis of ankle (Right); Benign essential hypertension; Bilateral carotid artery stenosis; CAD (coronary artery disease); Depression; Moderate mitral insufficiency; Post-traumatic osteoarthritis of knee (Right); Primary osteoarthritis of knee (Left); Rotator cuff tendinitis (Left); Severe aortic valve stenosis; Tendinitis of upper biceps tendon of shoulder (Left); Trigger middle finger of hand (Right); Benign prostatic hyperplasia; Presence of artificial ankle joint (Right); S/P  TAVR (transcatheter aortic valve replacement); Chronic pain syndrome; Pharmacologic therapy; Disorder of skeletal system; Problems influencing health status; Chronic use of opiate for therapeutic purpose; Abnormal MRI, lumbar spine (10/05/2013); Lumbosacral Retrolisthesis (L1-2, L2-3, L3-4, and L5-S1; DDD (degenerative disc disease), lumbosacral; Lumbar facet hypertrophy (Multilevel) (Bilateral); Lumbar lateral recess stenosis (Multilevel) (Bilateral); Lumbar foraminal stenosis (Multilevel) (Bilateral); Displacement of lumbar intervertebral disc (Multilevel); Chronic low back pain (1ry area of Pain) (Bilateral) w/o sciatica; Lumbar facet joint syndrome (Bilateral); Chronic hip pain (Left); Chronic sacroiliac joint pain (Right); Spondylosis without myelopathy or radiculopathy, lumbosacral region; and Status post left partial knee replacement on their problem list. His primarily concern today is the Back Pain (bilateral)  Pain Assessment: Location: Lower Back Radiating: denies Onset: More than a month ago Duration: Chronic pain Quality: Aching, Throbbing Severity: 4 /10 (subjective, self-reported pain score)  Effect on ADL: limits activities Timing: Constant Modifying factors: resting BP: 135/66  HR: 65  Antonio Villarreal comes in today for a follow-up visit after his initial evaluation on 05/20/2021. Today we went over the results of his tests. These were explained in "Layman's terms". During today's appointment we went over my diagnostic impression, as well as the proposed treatment plan.  This is a patient familiar to me.  According to the patient his primary area of pain is that of the lower back (Bilateral) (R>L).  He denies any back surgeries but does admit to having had some physical therapy and having used the services of a chiropractor for some time.  He also indicates having had some lumbar facet blocks done by me around 2015 which did provide him with over 6 months of pain relief.  Unfortunately,  when he attempted to come back to have it repeated, apparently I was out on a family emergency and he was unable to reconnect.  This is when he started seeing  the chiropractor.  He refers that the chiropractor seem to help but for very short periods of time (2 days).  He comes in today hoping that we can resume treatment with the facet injections.   Addendum: Later in the day I was able to get copies of his prior notes.  Apparently he was sent as a "Fast-Track" on 11/06/2013.  At that time he had a right L4-5 interlaminar LESI under fluoroscopic guidance using 40 mg of triamcinolone.  Subsequently, on 11/29/2013 he had a second L4-5 LESI on the right side, followed by a third on 02/28/2014.  On 06/17/2014 he had a right sided intra-articular knee joint injection with 80 mg of Depo-Medrol.   Physical exam today was positive for bilateral facet arthralgia on hyperextension on rotation maneuvers and also positive bilaterally on Kemp maneuver.  Inspection of the lumbar spine demonstrated scoliosis.  Provocative Patrick maneuver was positive on the right side for sacroiliac joint arthralgia and on the left side for hip joint arthralgia.    In considering the treatment plan options, Antonio Villarreal was reminded that I no longer take patients for medication management only. I asked him to let me know if he had no intention of taking advantage of the interventional therapies, so that we could make arrangements to provide this space to someone interested. I also made it clear that undergoing interventional therapies for the purpose of getting pain medications is very inappropriate on the part of a patient, and it will not be tolerated in this practice. This type of behavior would suggest true addiction and therefore it requires referral to an addiction specialist.   Further details on both, my assessment(s), as well as the proposed treatment plan, please see below.  Controlled Substance Pharmacotherapy Assessment REMS (Risk  Evaluation and Mitigation Strategy)  Analgesic: No chronic opioid analgesics therapy prescribed by our practice. Tramadol 50 mg tablet, 1 tab p.o. 5 times a day (04/16/2021) MME/day: 25 mg/day  Pill Count: None expected due to no prior prescriptions written by our practice. Dewayne Shorter, RN  07/02/2021  9:59 AM  Sign when Signing Visit Safety precautions to be maintained throughout the outpatient stay will include: orient to surroundings, keep bed in low position, maintain call bell within reach at all times, provide assistance with transfer out of bed and ambulation.   Pharmacokinetics: Liberation and absorption (onset of action): WNL Distribution (time to peak effect): WNL Metabolism and excretion (duration of action): WNL         Pharmacodynamics: Desired effects: Analgesia: Antonio Villarreal reports >50% benefit. Functional ability: Patient reports that medication allows him to accomplish basic ADLs Clinically meaningful improvement in function (CMIF): Sustained CMIF goals met Perceived effectiveness: Described as relatively effective, allowing for increase in activities of daily living (ADL) Undesirable effects: Side-effects or Adverse reactions: None reported Monitoring: [place] PMP: PDMP reviewed during this encounter. Online review of the past 42-monthperiod previously conducted. Not applicable at this point since we have not taken over the patient's medication management yet. List of other Serum/Urine Drug Screening Test(s):  Lab Results  Component Value Date   COCAINSCRNUR NONE DETECTED 05/20/2021   THCU NONE DETECTED 05/20/2021   List of all UDS test(s) done:  Lab Results  Component Value Date   SUMMARY Note 05/20/2021   Last UDS on record: Summary  Date Value Ref Range Status  05/20/2021 Note  Final    Comment:    ==================================================================== Compliance Drug Analysis,  Ur ==================================================================== Test  Result       Flag       Units  Drug Present and Declared for Prescription Verification   Acetaminophen                  PRESENT      EXPECTED   Naproxen                       PRESENT      EXPECTED  Drug Present not Declared for Prescription Verification   Gabapentin                     PRESENT      UNEXPECTED  Drug Absent but Declared for Prescription Verification   Salicylate                     Not Detected UNEXPECTED    Aspirin, as indicated in the declared medication list, is not always    detected even when used as directed.  ==================================================================== Test                      Result    Flag   Units      Ref Range   Creatinine              167              mg/dL      >=20 ==================================================================== Declared Medications:  The flagging and interpretation on this report are based on the  following declared medications.  Unexpected results may arise from  inaccuracies in the declared medications.   **Note: The testing scope of this panel includes these medications:   Naproxen (Aleve)   **Note: The testing scope of this panel does not include small to  moderate amounts of these reported medications:   Acetaminophen (Tylenol)  Aspirin   **Note: The testing scope of this panel does not include the  following reported medications:   Amlodipine  Brinzolamide  Caffeine  Eye Drop  Insulin (NovoLog)  Lisinopril  Rosuvastatin ==================================================================== For clinical consultation, please call [phone]. ====================================================================    UDS interpretation: No unexpected findings.          Medication Assessment Form: Patient introduced to form today Treatment compliance: Treatment may start today if  patient agrees with proposed plan. Evaluation of compliance is not applicable at this point Risk Assessment Profile: Aberrant behavior: See initial evaluations. None observed or detected today Comorbid factors increasing risk of overdose: See initial evaluation. No additional risks detected today Opioid risk tool (ORT):  Opioid Risk  05/20/2021  Alcohol 0  Illegal Drugs 0  Rx Drugs 0  Alcohol 0  Illegal Drugs 0  Rx Drugs 0  Age between 16-45 years  0  History of Preadolescent Sexual Abuse 0  Psychological Disease 0  Depression 0  Opioid Risk Tool Scoring 0  Opioid Risk Interpretation Low Risk    ORT Scoring interpretation table:  Score <3 = Low Risk for SUD  Score between 4-7 = Moderate Risk for SUD  Score >8 = High Risk for Opioid Abuse   Risk of substance use disorder (SUD): Low  Risk Mitigation Strategies:  Patient opioid safety counseling: Completed today. Counseling provided to patient as per "Patient Counseling Document". Document signed by patient, attesting to counseling and understanding Patient-Prescriber Agreement (PPA): Obtained today.  Controlled substance notification to other providers: Written and sent today.  Pharmacologic Plan: Today we  may be taking over the patient's pharmacological regimen. See below.             Laboratory Chemistry Profile   Renal Lab Results  Component Value Date   BUN 23 05/27/2021   CREATININE 1.00 05/27/2021   GFRAA >60 01/02/2018   GFRNONAA >60 05/27/2021   PROTEINUR 30 (A) 05/27/2021     Electrolytes Lab Results  Component Value Date   NA 139 05/27/2021   K 3.7 05/27/2021   CL 109 05/27/2021   CALCIUM 9.0 05/27/2021   MG 2.1 05/20/2021     Hepatic Lab Results  Component Value Date   AST 21 05/27/2021   ALT 15 05/27/2021   ALBUMIN 3.6 05/27/2021   ALKPHOS 68 05/27/2021   LIPASE 31 12/28/2017     ID Lab Results  Component Value Date   SARSCOV2NAA NEGATIVE 05/29/2021   STAPHAUREUS NEGATIVE 05/27/2021    MRSAPCR NEGATIVE 05/27/2021     Bone Lab Results  Component Value Date   25OHVITD1 45 05/20/2021   25OHVITD2 <1.0 05/20/2021   25OHVITD3 44 05/20/2021     Endocrine Lab Results  Component Value Date   GLUCOSE 223 (H) 05/27/2021   GLUCOSEU 50 (A) 05/27/2021   HGBA1C 9.5 (H) 09/15/2012     Neuropathy Lab Results  Component Value Date   VITAMINB12 234 05/20/2021   HGBA1C 9.5 (H) 09/15/2012     CNS No results found for: COLORCSF, APPEARCSF, RBCCOUNTCSF, WBCCSF, POLYSCSF, LYMPHSCSF, EOSCSF, PROTEINCSF, GLUCCSF, JCVIRUS, CSFOLI, IGGCSF, LABACHR, ACETBL, LABACHR, ACETBL   Inflammation (CRP: Acute  ESR: Chronic) Lab Results  Component Value Date   CRP 0.5 05/20/2021   ESRSEDRATE 8 05/20/2021   LATICACIDVEN 1.3 01/02/2018     Rheumatology No results found for: RF, ANA, LABURIC, URICUR, LYMEIGGIGMAB, LYMEABIGMQN, HLAB27   Coagulation Lab Results  Component Value Date   INR 1.35 09/18/2012   LABPROT 16.4 (H) 09/18/2012   APTT 35 09/18/2012   PLT 193 05/27/2021     Cardiovascular Lab Results  Component Value Date   BNP 342.0 (H) 01/02/2018   TROPONINI <0.03 01/02/2018   HGB 11.8 (L) 05/27/2021   HCT 35.3 (L) 05/27/2021     Screening Lab Results  Component Value Date   SARSCOV2NAA NEGATIVE 05/29/2021   STAPHAUREUS NEGATIVE 05/27/2021   MRSAPCR NEGATIVE 05/27/2021     Cancer No results found for: CEA, CA125, LABCA2   Allergens No results found for: ALMOND, APPLE, ASPARAGUS, AVOCADO, BANANA, BARLEY, BASIL, BAYLEAF, GREENBEAN, LIMABEAN, WHITEBEAN, BEEFIGE, REDBEET, BLUEBERRY, BROCCOLI, CABBAGE, MELON, CARROT, CASEIN, CASHEWNUT, CAULIFLOWER, CELERY     Note: Lab results reviewed.  Recent Diagnostic Imaging Review  Lumbosacral Imaging: Lumbar DG Bending views: Results for orders placed during the hospital encounter of 05/20/21 DG Lumbar Spine Complete W/Bend  Narrative CLINICAL DATA:  Low back pain, degenerative changes in the spine.  EXAM: LUMBAR SPINE -  COMPLETE WITH BENDING VIEWS  COMPARISON:  MRI of the lumbar spine from 2014  FINDINGS: Five lumbar type vertebral bodies. Degenerative moderate near complete loss of the disc space at spinal curvature, dextroconvexity of the lumbar spine with 8 mm rightward listhesis of L3 on L4. Osteopenia.  Near complete loss of the disc space at L3-4, L4-5 and L5-S1 associated with above findings.  Moderate disc space narrowing at L1-2, and L2-3.  Multilevel facet arthropathy greatest in the lower lumbar spine. No acute or destructive bone process.  Upon flexion no significant or abnormal motion. Also without signs of motion upon extension.  Vascular calcifications noted  in the abdominal aorta.  IMPRESSION: 1. Degenerative disc disease and facet arthropathy as described, marked in the lower lumbar spine. 2. Dextroconvex curvature of the lumbar spine with 8 mm rightward listhesis of L3 on L4. Overall degenerative changes show worsening since 2014 MRI. 3. No signs of motion upon flexion and extension.   Electronically Signed By: Zetta Bills M.D. On: 05/21/2021 09:32  Sacroiliac Joint Imaging: Sacroiliac Joint DG: Results for orders placed during the hospital encounter of 05/20/21 DG Si Joints  Narrative CLINICAL DATA:  Right SI joint region pain.  EXAM: BILATERAL SACROILIAC JOINTS - 3+ VIEW  COMPARISON:  Lumbar MRI, 10/04/2013.  FINDINGS: No fracture or acute finding.  No bone lesion.  SI joints are normally spaced and aligned. No degenerative/arthropathic changes.  There are disc degenerative changes at L5-S1. Previous right proximal femur fracture has been reduced with a compression screw and lateral fixation plate, fracture healed, orthopedic hardware well-seated.  Diffuse iliac and femoral artery vascular calcifications.  IMPRESSION: 1. No fracture or acute finding.  No bone lesion. 2. Normally spaced and aligned SI joints. No degenerative/arthropathic  change.   Electronically Signed By: Lajean Manes M.D. On: 05/21/2021 09:32  Hip Imaging: Hip-L DG 2-3 views: Results for orders placed during the hospital encounter of 05/20/21 DG HIP UNILAT W OR W/O PELVIS 2-3 VIEWS LEFT  Narrative CLINICAL DATA:  Left hip pain/arthralgia.  EXAM: DG HIP (WITH OR WITHOUT PELVIS) 2-3V LEFT  COMPARISON:  No recent prior.  FINDINGS: Diffuse osteopenia. Degenerative changes lumbar spine, both SI joints, both hips. Right femoral plate and screw noted. Mild loosening of the femoral screw cannot be excluded. No acute bony abnormality. No evidence of fracture or dislocation. Aortoiliac and peripheral atherosclerotic vascular calcification. Surgical clips left groin.  IMPRESSION: 1. Diffuse osteopenia. Degenerative changes lumbar spine, both SI joints, both hips.  2. Right femoral plate and screw noted. Mild loosening of the femoral screw cannot be excluded.  3.  No acute bony abnormality.  4.  Aortoiliac and peripheral vascular disease.   Electronically Signed By: Marcello Moores  Register On: 05/21/2021 09:33  Complexity Note: Imaging results reviewed. Results shared with Antonio Villarreal, using Layman's terms.                        Meds   Current Outpatient Medications:    acetaminophen (TYLENOL) 650 MG CR tablet, Take 1,300 mg by mouth every 8 (eight) hours as needed for pain., Disp: , Rfl:    amLODipine (NORVASC) 5 MG tablet, Take 5 mg by mouth every morning., Disp: , Rfl:    apixaban (ELIQUIS) 2.5 MG TABS tablet, Take 1 tablet (2.5 mg total) by mouth 2 (two) times daily., Disp: 30 tablet, Rfl: 0   brinzolamide (AZOPT) 1 % ophthalmic suspension, Place 1 drop into both eyes 2 (two) times daily., Disp: , Rfl:    insulin glargine (LANTUS) 100 UNIT/ML injection, Inject 20 Units into the skin every morning., Disp: , Rfl:    latanoprost (XALATAN) 0.005 % ophthalmic solution, Place 1 drop into both eyes at bedtime. , Disp: , Rfl: 0   lisinopril  (PRINIVIL,ZESTRIL) 5 MG tablet, Take 1 tablet (5 mg total) by mouth daily. (Patient taking differently: Take 5 mg by mouth at bedtime.), Disp: 30 tablet, Rfl: 1   NOVOLOG FLEXPEN 100 UNIT/ML FlexPen, Inject 8 Units into the skin 3 (three) times daily., Disp: , Rfl:    oxyCODONE (ROXICODONE) 5 MG immediate release tablet, Take 1-2 tablets (  5-10 mg total) by mouth every 4 (four) hours as needed for moderate pain or severe pain., Disp: 40 tablet, Rfl: 0   rosuvastatin (CRESTOR) 5 MG tablet, Take 5 mg by mouth at bedtime., Disp: , Rfl: 2  Current Facility-Administered Medications:    lidocaine (XYLOCAINE) 2 % (with pres) injection 400 mg, 20 mL, Infiltration, Once, Milinda Pointer, MD   pentafluoroprop-tetrafluoroeth (GEBAUERS) aerosol, , Topical, Once, Milinda Pointer, MD   ropivacaine (PF) 2 mg/mL (0.2%) (NAROPIN) injection 18 mL, 18 mL, Peri-NEURAL, Once, Milinda Pointer, MD   triamcinolone acetonide (KENALOG-40) injection 80 mg, 80 mg, Other, Once, Milinda Pointer, MD  ROS  Constitutional: Denies any fever or chills Gastrointestinal: No reported hemesis, hematochezia, vomiting, or acute GI distress Musculoskeletal: Denies any acute onset joint swelling, redness, loss of ROM, or weakness Neurological: No reported episodes of acute onset apraxia, aphasia, dysarthria, agnosia, amnesia, paralysis, loss of coordination, or loss of consciousness  Allergies  Antonio Villarreal is allergic to alphagan [brimonidine], januvia [sitagliptin], morphine and related, and timolol.  [place]  Drug: Antonio Villarreal  reports no history of drug use. Alcohol:  reports no history of alcohol use. Tobacco:  reports that he quit smoking about 33 years ago. His smoking use included cigarettes. He has a 52.50 pack-year smoking history. He has never used smokeless tobacco. Medical:  has a past medical history of Aneurysm of infrarenal abdominal aorta ([place]) (2020), Aortic stenosis, severe, Arthritis, Bilateral carotid artery  stenosis, BPH (benign prostatic hyperplasia), Cancer ([place]), DDD (degenerative disc disease), thoracic, Depression, Diabetes mellitus ([place]), Dyspnea, Dyspnea on exertion, Erectile dysfunction, Fatigue, CABG (09/18/2012), Hyperlipidemia, Hypertension, Motion sickness, Thromboembolism ([place]) (1989), and Wears dentures. Surgical: Antonio Villarreal  has a past surgical history that includes Hip surgery (Right, 1989); Ankle surgery; Tonsillectomy; Cardiac catheterization; Coronary artery bypass graft (09/18/2012); TEE without cardioversion (09/18/2012); Cataract extraction w/PHACO (Left, 01/17/2017); RIGHT/LEFT HEART CATH AND CORONARY/GRAFT ANGIOGRAPHY (N/A, 02/01/2019); Cataract extraction w/PHACO (Right, 09/25/2019); Cardiac valve replacement (03/13/2019); Hernia repair; Eye surgery; Trigger finger release (Right, 04/15/2021); and Partial knee arthroplasty (Left, 06/02/2021). Family: family history includes Coronary artery disease in his mother; Heart attack in his father; Heart disease in his father and mother; Lung cancer in his mother.  Constitutional Exam  General appearance: Well nourished, well developed, and well hydrated. In no apparent acute distress Vitals:   07/02/21 0950  BP: 135/66  Pulse: 65  Resp: 16  Temp: (!) 97.1 F (36.2 C)  TempSrc: Temporal  SpO2: 96%  Weight: 191 lb (86.6 kg)  Height: '5\' 10"'  (1.778 m)   BMI Assessment: Estimated body mass index is 27.41 kg/m as calculated from the following:   Height as of this encounter: '5\' 10"'  (1.778 m).   Weight as of this encounter: 191 lb (86.6 kg).  BMI interpretation table: BMI level Category Range association with higher incidence of chronic pain  <18 kg/m2 Underweight   18.5-24.9 kg/m2 Ideal body weight   25-29.9 kg/m2 Overweight Increased incidence by 20%  30-34.9 kg/m2 Obese (Class I) Increased incidence by 68%  35-39.9 kg/m2 Severe obesity (Class II) Increased incidence by 136%  >40 kg/m2 Extreme obesity (Class III) Increased incidence  by 254%   Patient's current BMI Ideal Body weight  Body mass index is 27.41 kg/m. Ideal body weight: 73 kg (160 lb 15 oz) Adjusted ideal body weight: 78.5 kg (172 lb 15.4 oz)   BMI Readings from Last 4 Encounters:  07/02/21 27.41 kg/m  06/02/21 27.41 kg/m  05/20/21 27.41 kg/m  04/09/21 28.03 kg/m  Wt Readings from Last 4 Encounters:  07/02/21 191 lb (86.6 kg)  06/02/21 191 lb (86.6 kg)  05/20/21 191 lb (86.6 kg)  04/09/21 201 lb (91.2 kg)    Psych/Mental status: Alert, oriented x 3 (person, place, & time)       Eyes: PERLA Respiratory: No evidence of acute respiratory distress  Assessment & Plan  Primary Diagnosis & Pertinent Problem List: The primary encounter diagnosis was Lumbar facet joint syndrome (Bilateral). Diagnoses of DDD (degenerative disc disease), lumbosacral, Spondylosis without myelopathy or radiculopathy, lumbosacral region, Displacement of lumbar intervertebral disc (Multilevel), Lumbar facet hypertrophy (Multilevel) (Bilateral), Lumbosacral Retrolisthesis (L1-2, L2-3, L3-4, and L5-S1, and Chronic low back pain (1ry area of Pain) (Bilateral) w/o sciatica were also pertinent to this visit.  Visit Diagnosis: 1. Lumbar facet joint syndrome (Bilateral)   2. DDD (degenerative disc disease), lumbosacral   3. Spondylosis without myelopathy or radiculopathy, lumbosacral region   4. Displacement of lumbar intervertebral disc (Multilevel)   5. Lumbar facet hypertrophy (Multilevel) (Bilateral)   6. Lumbosacral Retrolisthesis (L1-2, L2-3, L3-4, and L5-S1   7. Chronic low back pain (1ry area of Pain) (Bilateral) w/o sciatica    Problems updated and reviewed during this visit: Problem  Status Post Left Partial Knee Replacement    Plan of Care  Pharmacotherapy (Medications Ordered): Meds ordered this encounter  Medications   lidocaine (XYLOCAINE) 2 % (with pres) injection 400 mg   ropivacaine (PF) 2 mg/mL (0.2%) (NAROPIN) injection 18 mL   triamcinolone  acetonide (KENALOG-40) injection 80 mg   pentafluoroprop-tetrafluoroeth (GEBAUERS) aerosol    Procedure Orders         LUMBAR FACET(MEDIAL BRANCH NERVE BLOCK) MBNB     Lab Orders  No laboratory test(s) ordered today   Imaging Orders         DG PAIN CLINIC C-ARM 1-60 MIN NO REPORT     Referral Orders  No referral(s) requested today    Pharmacological management options:  Opioid Analgesics: We'll take over management today. See above orders Membrane stabilizer: Options discussed, including a trial. Muscle relaxant: We have discussed the possibility of a trial NSAID: Trial discussed. Other analgesic(s): To be determined at a later time      Interventional Therapies  Risk  Complexity Considerations:   Estimated body mass index is 27.41 kg/m as calculated from the following:   Height as of this encounter: '5\' 10"'  (1.778 m).   Weight as of this encounter: 191 lb (86.6 kg). WNL   Planned  Pending:   Diagnostic bilateral lumbar facet block #1    Under consideration:   Diagnostic bilateral lumbar facet block #1    Completed:   Therapeutic right L4-5 LESI x3 (11/06/2013 through 02/28/2014)  Therapeutic right IA steroid knee injection x1 (06/17/2014)    Therapeutic  Palliative (PRN) options:   None established     Provider-requested follow-up: Return in about 2 weeks (around 07/16/2021) for (T,Th) (68F) (PPE). Recent Visits Date Type Provider Dept  05/20/21 Office Visit Milinda Pointer, MD [facility]  Showing recent visits within past 90 days and meeting all other requirements Today's Visits Date Type Provider Dept  07/02/21 Procedure visit Milinda Pointer, MD [facility]  Showing today's visits and meeting all other requirements Future Appointments No visits were found meeting these conditions. Showing future appointments within next 90 days and meeting all other requirements Primary Care Physician: Sofie Hartigan, MD Note by: Gaspar Cola, MD Date: 07/02/2021; Time: 10:29 AM

## 2021-07-02 NOTE — Patient Instructions (Signed)

## 2021-07-03 ENCOUNTER — Telehealth: Payer: Self-pay

## 2021-07-03 NOTE — Telephone Encounter (Signed)
Post procedure phone call.  Patient states he is doing good.  

## 2021-07-07 DIAGNOSIS — I10 Essential (primary) hypertension: Secondary | ICD-10-CM | POA: Diagnosis not present

## 2021-07-07 DIAGNOSIS — I6523 Occlusion and stenosis of bilateral carotid arteries: Secondary | ICD-10-CM | POA: Diagnosis not present

## 2021-07-07 DIAGNOSIS — I2510 Atherosclerotic heart disease of native coronary artery without angina pectoris: Secondary | ICD-10-CM | POA: Diagnosis not present

## 2021-07-07 DIAGNOSIS — I350 Nonrheumatic aortic (valve) stenosis: Secondary | ICD-10-CM | POA: Diagnosis not present

## 2021-07-07 DIAGNOSIS — I714 Abdominal aortic aneurysm, without rupture: Secondary | ICD-10-CM | POA: Diagnosis not present

## 2021-07-07 DIAGNOSIS — E782 Mixed hyperlipidemia: Secondary | ICD-10-CM | POA: Diagnosis not present

## 2021-07-13 NOTE — Progress Notes (Signed)
PROVIDER NOTE: Information contained herein reflects review and annotations entered in association with encounter. Interpretation of such information and data should be left to medically-trained personnel. Information provided to patient can be located elsewhere in the medical record under "Patient Instructions". Document created using STT-dictation technology, any transcriptional errors that may result from process are unintentional.    Patient: Antonio Favor Sr.  Service Category: E/M  Provider: Gaspar Cola, MD  DOB: [birth year]  DOS: 07/14/2021  Specialty: Interventional Pain Management  MRN: [id number]  Setting: Ambulatory outpatient  PCP: Sofie Hartigan, MD  Type: Established Patient    Referring Provider: Sofie Hartigan, MD  Location: Office  Delivery: Face-to-face     HPI  Mr. Antonio Koontz Sr., a 80 y.o. year old male, is here today because of his Lumbar facet joint syndrome [M47.816]. Mr. Meidinger primary complain today is Back Pain Last encounter: My last encounter with him was on 07/02/2021. Pertinent problems: Mr. Fosdick has Arthritis of ankle (Right); Post-traumatic osteoarthritis of knee (Right); Primary osteoarthritis of knee (Left); Rotator cuff tendinitis (Left); Tendinitis of upper biceps tendon of shoulder (Left); Trigger middle finger of hand (Right); Presence of artificial ankle joint (Right); Chronic pain syndrome; Abnormal MRI, lumbar spine (10/05/2013); Lumbosacral Retrolisthesis (L1-2, L2-3, L3-4, and L5-S1; DDD (degenerative disc disease), lumbosacral; Lumbar facet hypertrophy (Multilevel) (Bilateral); Lumbar lateral recess stenosis (Multilevel) (Bilateral); Lumbar foraminal stenosis (Multilevel) (Bilateral); Displacement of lumbar intervertebral disc (Multilevel); Chronic low back pain (1ry area of Pain) (Bilateral) w/o sciatica; Lumbar facet joint syndrome (Bilateral); Chronic hip pain (Left); Chronic sacroiliac joint pain (Right); and Spondylosis without  myelopathy or radiculopathy, lumbosacral region on their pertinent problem list. Pain Assessment: Severity of Chronic pain is reported as a 2 /10. Location: Back Lower/Denies. Onset: More than a month ago. Quality: Aching. Timing: Intermittent. Modifying factor(s): procedures, resting. Vitals:  height is '5\' 10"'  (1.778 m) and weight is 190 lb (86.2 kg). His temporal temperature is 97 F (36.1 C) (abnormal). His blood pressure is 155/64 (abnormal) and his pulse is 72. His respiration is 16 and oxygen saturation is 98%.   Reason for encounter: post-procedure assessment.  The patient refers having attained excellent relief of the pain with the bilateral lumbar facet block which indicates and confirms that his pain was going from the facet joints in the lumbar spine.  He refers having gotten a new lease on life.  He also refers that he has been completely pain-free until recently when he started doing some additional work and prolonged walking which seems to have given him a little bit of discomfort.  Today I have provided him with written information regarding Preemptive Analgesia techniques.  I have reminded him that this is not something to be done on a daily basis and he should limit the use of that technique to whenever he knows he will be doing something out of the ordinary, perhaps once a week.  He understood and accepted.  Post-Procedure Evaluation  Procedure (07/02/2021):  Type: Lumbar Facet, Medial Branch Block(s) #1 Primary Purpose: Diagnostic Region: Posterolateral Lumbosacral Spine Level: L2, L3, L4, L5, & S1 Medial Branch Level(s). Injecting these levels blocks the L3-4, L4-5, and L5-S1 lumbar facet joints. Laterality: Bilateral  Pre-procedure pain level: 4/10 Post-procedure: 0/10 (100% relief)  Anxiolysis: none.  Effectiveness during initial hour after procedure (Ultra-Short Term Relief): 100 %.  Local anesthetic used: Long-acting (4-6 hours) Effectiveness: Defined as any analgesic  benefit obtained secondary to the administration of local anesthetics. This carries significant diagnostic  value as to the etiological location, or anatomical origin, of the pain. Duration of benefit is expected to coincide with the duration of the local anesthetic used.  Effectiveness during initial 4-6 hours after procedure (Short-Term Relief): 100 %.  Long-term benefit: Defined as any relief past the pharmacologic duration of the local anesthetics.  Effectiveness past the initial 6 hours after procedure (Long-Term Relief): 80 % (ongoing).  Benefits, current: Defined as benefit present at the time of this evaluation.   Analgesia:   The patient indicates having attained an ongoing 80% relief of the pain on both sides of the lower back. Function: Mr. Schaner reports improvement in function ROM: Mr. Schubert reports improvement in ROM  Pharmacotherapy Assessment  Analgesic: No chronic opioid analgesics therapy prescribed by our practice. Tramadol 50 mg tablet, 1 tab p.o. 5 times a day (04/16/2021) MME/day: 25 mg/day   Monitoring: [place] PMP: PDMP reviewed during this encounter.       Pharmacotherapy: No side-effects or adverse reactions reported. Compliance: No problems identified. Effectiveness: Clinically acceptable.  Chauncey Fischer, RN  07/14/2021  8:45 AM  Sign when Signing Visit Safety precautions to be maintained throughout the outpatient stay will include: orient to surroundings, keep bed in low position, maintain call bell within reach at all times, provide assistance with transfer out of bed and ambulation.      UDS:  Summary  Date Value Ref Range Status  05/20/2021 Note  Final    Comment:    ==================================================================== Compliance Drug Analysis, Ur ==================================================================== Test                             Result       Flag       Units  Drug Present and Declared for Prescription Verification    Acetaminophen                  PRESENT      EXPECTED   Naproxen                       PRESENT      EXPECTED  Drug Present not Declared for Prescription Verification   Gabapentin                     PRESENT      UNEXPECTED  Drug Absent but Declared for Prescription Verification   Salicylate                     Not Detected UNEXPECTED    Aspirin, as indicated in the declared medication list, is not always    detected even when used as directed.  ==================================================================== Test                      Result    Flag   Units      Ref Range   Creatinine              167              mg/dL      >=20 ==================================================================== Declared Medications:  The flagging and interpretation on this report are based on the  following declared medications.  Unexpected results may arise from  inaccuracies in the declared medications.   **Note: The testing scope of this panel includes these medications:   Naproxen (Aleve)   **Note: The testing scope of this panel  does not include small to  moderate amounts of these reported medications:   Acetaminophen (Tylenol)  Aspirin   **Note: The testing scope of this panel does not include the  following reported medications:   Amlodipine  Brinzolamide  Caffeine  Eye Drop  Insulin (NovoLog)  Lisinopril  Rosuvastatin ==================================================================== For clinical consultation, please call [phone]. ====================================================================      ROS  Constitutional: Denies any fever or chills Gastrointestinal: No reported hemesis, hematochezia, vomiting, or acute GI distress Musculoskeletal: Denies any acute onset joint swelling, redness, loss of ROM, or weakness Neurological: No reported episodes of acute onset apraxia, aphasia, dysarthria, agnosia, amnesia, paralysis, loss of coordination, or loss of  consciousness  Medication Review  acetaminophen, amLODipine, brinzolamide, insulin aspart, insulin glargine, latanoprost, lisinopril, oxyCODONE, and rosuvastatin  History Review  Allergy: Mr. Welter is allergic to alphagan [brimonidine], januvia [sitagliptin], morphine and related, and timolol. Drug: Mr. Labreck  reports no history of drug use. Alcohol:  reports no history of alcohol use. Tobacco:  reports that he quit smoking about 33 years ago. His smoking use included cigarettes. He has a 52.50 pack-year smoking history. He has never used smokeless tobacco. Social: Mr. Holzheimer  reports that he quit smoking about 33 years ago. His smoking use included cigarettes. He has a 52.50 pack-year smoking history. He has never used smokeless tobacco. He reports that he does not drink alcohol and does not use drugs. 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 Surgical: Mr. Seeman  has a past surgical history that includes Hip surgery (Right, 1989); Ankle surgery; Tonsillectomy; Cardiac catheterization; Coronary artery bypass graft (09/18/2012); TEE without cardioversion (09/18/2012); Cataract extraction w/PHACO (Left, 01/17/2017); RIGHT/LEFT HEART CATH AND CORONARY/GRAFT ANGIOGRAPHY (N/A, 02/01/2019); Cataract extraction w/PHACO (Right, 09/25/2019); Cardiac valve replacement (03/13/2019); Hernia repair; Eye surgery; Trigger finger release (Right, 04/15/2021); and Partial knee arthroplasty (Left, 06/02/2021). Family: family history includes Coronary artery disease in his mother; Heart attack in his  father; Heart disease in his father and mother; Lung cancer in his mother.  Laboratory Chemistry Profile   Renal Lab Results  Component Value Date   BUN 23 05/27/2021   CREATININE 1.00 05/27/2021   GFRAA >60 01/02/2018   GFRNONAA >60 05/27/2021    Hepatic Lab Results  Component Value Date   AST 21 05/27/2021   ALT 15 05/27/2021   ALBUMIN 3.6 05/27/2021   ALKPHOS 68 05/27/2021   LIPASE 31 12/28/2017    Electrolytes Lab Results  Component Value Date   NA 139 05/27/2021   K 3.7 05/27/2021   CL 109 05/27/2021   CALCIUM 9.0 05/27/2021   MG 2.1 05/20/2021    Bone Lab Results  Component Value Date   25OHVITD1 45 05/20/2021   25OHVITD2 <1.0 05/20/2021   25OHVITD3 44 05/20/2021    Inflammation (CRP: Acute Phase) (ESR: Chronic Phase) Lab Results  Component Value Date   CRP 0.5 05/20/2021   ESRSEDRATE 8 05/20/2021   LATICACIDVEN 1.3 01/02/2018         Note: Above Lab results reviewed.  Recent Imaging Review  DG PAIN CLINIC C-ARM 1-60 MIN NO REPORT Fluoro was used, but no Radiologist interpretation will be provided.  Please refer to "NOTES" tab for provider progress note.  Note: Reviewed        Physical Exam  General appearance: Well nourished, well developed, and well hydrated. In no apparent acute distress Mental status: Alert, oriented x 3 (person, place, & time)       Respiratory: No evidence of acute respiratory distress Eyes: PERLA Vitals: BP (!) 155/64 (BP Location: Right Arm, Patient Position: Sitting, Cuff Size: Large)   Pulse 72   Temp (!) 97 F (36.1 C) (Temporal)   Resp 16   Ht '5\' 10"'  (1.778 m)   Wt 190 lb (86.2 kg)   SpO2 98%   BMI 27.26 kg/m  BMI: Estimated body mass index is 27.26 kg/m as calculated from the following:   Height as of this encounter: '5\' 10"'  (1.778 m).   Weight as of this encounter: 190 lb (86.2 kg). Ideal: Ideal body weight: 73 kg (160 lb 15 oz) Adjusted ideal body weight: 78.3 kg (172 lb 9 oz)  Assessment   Status  Diagnosis  Controlled Controlled Controlled 1. Lumbar facet joint syndrome (Bilateral)   2. Chronic low back pain (1ry area of Pain) (Bilateral) w/o sciatica   3. Chronic pain syndrome      Updated Problems: No problems updated.  Plan of Care  Problem-specific:  No problem-specific Assessment & Plan notes found for this encounter.  Mr. Talley Kreiser Sr. has a current medication list which includes the following long-term medication(s): amlodipine, insulin glargine, lisinopril, and rosuvastatin.  Pharmacotherapy (Medications Ordered): No orders of the defined types were placed in this encounter.  Orders:  No orders of the defined types were placed in this encounter.  Follow-up plan:   Return if symptoms worsen or fail to improve.     Interventional Therapies  Risk  Complexity Considerations:   Estimated body mass index is 27.41 kg/m as calculated from the following:   Height as of this encounter: '5\' 10"'  (1.778 m).   Weight as of this encounter: 191 lb (86.6 kg). WNL   Planned  Pending:      Under consideration:   Diagnostic bilateral lumbar facet block #2    Completed:   Therapeutic right L4-5 LESI x3 (11/06/2013 through 02/28/2014)  Therapeutic right IA steroid knee injection x1 (06/17/2014)  Diagnostic/therapeutic bilateral lumbar facet MBB x1 (07/02/2021) (100/100/80/80    Therapeutic  Palliative (PRN) options:   Diagnostic bilateral lumbar facet block #2     Recent Visits Date Type Provider Dept  07/02/21 Procedure visit Milinda Pointer, MD [facility]  05/20/21 Office Visit Milinda Pointer, MD [facility]  Showing recent visits within past 90 days and meeting all other requirements Today's Visits Date Type Provider Dept  07/14/21 Office Visit Milinda Pointer, MD [facility]  Showing today's visits and meeting all other requirements Future Appointments No visits were found meeting these conditions. Showing  future appointments within next 90 days and meeting all other requirements I discussed the assessment and treatment plan with the patient. The patient was provided an opportunity to ask questions and all were answered. The patient agreed with the plan and demonstrated an understanding of the instructions.  Patient advised to call back or seek an in-person evaluation if the symptoms or condition worsens.  Duration of encounter: 30 minutes.  Note by: Gaspar Cola, MD Date: 07/14/2021; Time: 9:38 AM

## 2021-07-14 ENCOUNTER — Other Ambulatory Visit: Payer: Self-pay

## 2021-07-14 ENCOUNTER — Ambulatory Visit: Payer: HMO | Attending: Pain Medicine | Admitting: Pain Medicine

## 2021-07-14 ENCOUNTER — Encounter: Payer: Self-pay | Admitting: Pain Medicine

## 2021-07-14 VITALS — BP 155/64 | HR 72 | Temp 97.0°F | Resp 16 | Ht 70.0 in | Wt 190.0 lb

## 2021-07-14 DIAGNOSIS — M47816 Spondylosis without myelopathy or radiculopathy, lumbar region: Secondary | ICD-10-CM | POA: Diagnosis not present

## 2021-07-14 DIAGNOSIS — G894 Chronic pain syndrome: Secondary | ICD-10-CM | POA: Insufficient documentation

## 2021-07-14 DIAGNOSIS — G8929 Other chronic pain: Secondary | ICD-10-CM | POA: Diagnosis not present

## 2021-07-14 DIAGNOSIS — M545 Low back pain, unspecified: Secondary | ICD-10-CM | POA: Diagnosis not present

## 2021-07-14 NOTE — Progress Notes (Signed)
Safety precautions to be maintained throughout the outpatient stay will include: orient to surroundings, keep bed in low position, maintain call bell within reach at all times, provide assistance with transfer out of bed and ambulation.  

## 2021-07-14 NOTE — Patient Instructions (Signed)
____________________________________________________________________________________________  Pain Prevention Technique  Definition:   A technique used to minimize the effects of an activity known to cause inflammation or swelling, which in turn leads to an increase in pain.  Purpose: To prevent swelling from occurring. It is based on the fact that it is easier to prevent swelling from happening than it is to get rid of it, once it occurs.  Contraindications: Anyone with allergy or hypersensitivity to the recommended medications. Anyone taking anticoagulants (Blood Thinners) (e.g., Coumadin, Warfarin, Plavix, etc.). Patients in Renal Failure.  Technique: Before you undertake an activity known to cause pain, or a flare-up of your chronic pain, and before you experience any pain, do the following:  On a full stomach, take 4 (four) over the counter Ibuprofens 200mg tablets (Motrin), for a total of 800 mg. In addition, take over the counter Magnesium 400 to 500 mg, before doing the activity.  Six (6) hours later, again on a full stomach, repeat the Ibuprofen. That night, take a warm shower and stretch under the running warm water.  This technique may be sufficient to abort the pain and discomfort before it happens. Keep in mind that it takes a lot less medication to prevent swelling than it takes to eliminate it once it occurs.  ____________________________________________________________________________________________   

## 2021-07-17 DIAGNOSIS — Z96652 Presence of left artificial knee joint: Secondary | ICD-10-CM | POA: Diagnosis not present

## 2021-07-28 DIAGNOSIS — J060 Acute laryngopharyngitis: Secondary | ICD-10-CM | POA: Diagnosis not present

## 2021-07-28 DIAGNOSIS — U071 COVID-19: Secondary | ICD-10-CM | POA: Diagnosis not present

## 2021-07-28 DIAGNOSIS — Z03818 Encounter for observation for suspected exposure to other biological agents ruled out: Secondary | ICD-10-CM | POA: Diagnosis not present

## 2021-09-04 DIAGNOSIS — M1712 Unilateral primary osteoarthritis, left knee: Secondary | ICD-10-CM | POA: Diagnosis not present

## 2021-09-04 DIAGNOSIS — Z96652 Presence of left artificial knee joint: Secondary | ICD-10-CM | POA: Diagnosis not present

## 2021-09-04 DIAGNOSIS — M1731 Unilateral post-traumatic osteoarthritis, right knee: Secondary | ICD-10-CM | POA: Diagnosis not present

## 2021-09-10 DIAGNOSIS — E1159 Type 2 diabetes mellitus with other circulatory complications: Secondary | ICD-10-CM | POA: Diagnosis not present

## 2021-09-10 DIAGNOSIS — I7143 Infrarenal abdominal aortic aneurysm, without rupture: Secondary | ICD-10-CM | POA: Diagnosis not present

## 2021-09-10 DIAGNOSIS — E782 Mixed hyperlipidemia: Secondary | ICD-10-CM | POA: Diagnosis not present

## 2021-09-10 DIAGNOSIS — G8929 Other chronic pain: Secondary | ICD-10-CM | POA: Diagnosis not present

## 2021-09-10 DIAGNOSIS — M545 Low back pain, unspecified: Secondary | ICD-10-CM | POA: Diagnosis not present

## 2021-09-10 DIAGNOSIS — I10 Essential (primary) hypertension: Secondary | ICD-10-CM | POA: Diagnosis not present

## 2021-09-10 DIAGNOSIS — Z794 Long term (current) use of insulin: Secondary | ICD-10-CM | POA: Diagnosis not present

## 2021-09-10 DIAGNOSIS — Z952 Presence of prosthetic heart valve: Secondary | ICD-10-CM | POA: Diagnosis not present

## 2021-09-30 ENCOUNTER — Other Ambulatory Visit: Payer: Self-pay | Admitting: Surgery

## 2021-10-08 ENCOUNTER — Other Ambulatory Visit
Admission: RE | Admit: 2021-10-08 | Discharge: 2021-10-08 | Disposition: A | Discharge status: Home / Self Care | Payer: HMO | Source: Ambulatory Visit | Attending: Surgery | Admitting: Surgery

## 2021-10-08 ENCOUNTER — Other Ambulatory Visit: Payer: Self-pay

## 2021-10-08 ENCOUNTER — Encounter: Payer: Self-pay | Admitting: Surgery

## 2021-10-08 VITALS — BP 126/77 | HR 74 | Resp 16 | Ht 70.0 in | Wt 199.5 lb

## 2021-10-08 DIAGNOSIS — Z01812 Encounter for preprocedural laboratory examination: Secondary | ICD-10-CM | POA: Insufficient documentation

## 2021-10-08 DIAGNOSIS — Z01818 Encounter for other preprocedural examination: Secondary | ICD-10-CM

## 2021-10-08 HISTORY — DX: Personal history of other diseases of the digestive system: Z87.19

## 2021-10-08 LAB — COMPREHENSIVE METABOLIC PANEL
ALT: 17 U/L (ref 0–44)
AST: 20 U/L (ref 15–41)
Albumin: 3.8 g/dL (ref 3.5–5.0)
Alkaline Phosphatase: 82 U/L (ref 38–126)
Anion gap: 7 (ref 5–15)
BUN: 32 mg/dL — ABNORMAL HIGH (ref 8–23)
CO2: 25 mmol/L (ref 22–32)
Calcium: 9.3 mg/dL (ref 8.9–10.3)
Chloride: 107 mmol/L (ref 98–111)
Creatinine, Ser: 1.12 mg/dL (ref 0.61–1.24)
GFR, Estimated: >60 mL/min (ref >60)
Glucose, Bld: 171 mg/dL — ABNORMAL HIGH (ref 70–99)
Potassium: 4.1 mmol/L (ref 3.5–5.1)
Sodium: 139 mmol/L (ref 135–145)
Total Bilirubin: 1.1 mg/dL (ref 0.3–1.2)
Total Protein: 7.1 g/dL (ref 6.5–8.1)

## 2021-10-08 LAB — URINALYSIS, ROUTINE W REFLEX MICROSCOPIC
Bilirubin Urine: NEGATIVE
Glucose, UA: NEGATIVE mg/dL
Hgb urine dipstick: NEGATIVE
Ketones, ur: NEGATIVE mg/dL
Leukocytes,Ua: NEGATIVE
Nitrite: NEGATIVE
Protein, ur: NEGATIVE mg/dL
Specific Gravity, Urine: 1.013 (ref 1.005–1.030)
pH: 6 (ref 5.0–8.0)

## 2021-10-08 LAB — CBC WITH DIFFERENTIAL/PLATELET
Abs Immature Granulocytes: 0.01 10*3/uL (ref 0.00–0.07)
Basophils Absolute: 0.2 10*3/uL — ABNORMAL HIGH (ref 0.0–0.1)
Basophils Relative: 2 %
Eosinophils Absolute: 0.5 10*3/uL (ref 0.0–0.5)
Eosinophils Relative: 7 %
HCT: 38.5 % — ABNORMAL LOW (ref 39.0–52.0)
Hemoglobin: 12.5 g/dL — ABNORMAL LOW (ref 13.0–17.0)
Immature Granulocytes: 0 %
Lymphocytes Relative: 30 %
Lymphs Abs: 2.1 10*3/uL (ref 0.7–4.0)
MCH: 28.8 pg (ref 26.0–34.0)
MCHC: 32.5 g/dL (ref 30.0–36.0)
MCV: 88.7 fL (ref 80.0–100.0)
Monocytes Absolute: 0.7 10*3/uL (ref 0.1–1.0)
Monocytes Relative: 10 %
Neutro Abs: 3.6 10*3/uL (ref 1.7–7.7)
Neutrophils Relative %: 51 %
Platelets: 250 10*3/uL (ref 150–400)
RBC: 4.34 MIL/uL (ref 4.22–5.81)
RDW: 14.4 % (ref 11.5–15.5)
WBC: 7 10*3/uL (ref 4.0–10.5)
nRBC: 0 % (ref 0.0–0.2)

## 2021-10-08 LAB — TYPE AND SCREEN
ABO/RH(D): O POS
Antibody Screen: NEGATIVE

## 2021-10-08 LAB — SURGICAL PCR SCREEN
MRSA, PCR: NEGATIVE
Staphylococcus aureus: NEGATIVE

## 2021-10-08 NOTE — Patient Instructions (Addendum)
Your procedure is scheduled on:10-20-21 Tuesday Report to the Registration Desk on the 1st floor of the [facility].Then proceed to the 2nd floor Surgery Desk in the [facility] To find out your arrival time, please call [phone] between 1PM - 3PM on:10-19-21 Monday  REMEMBER: Instructions that are not followed completely may result in serious medical risk, up to and including death; or upon the discretion of your surgeon and anesthesiologist your surgery may need to be rescheduled.  Do not eat food after midnight the night before surgery.  No gum chewing, lozengers or hard candies.  You may however, drink Water up to 2 hours before you are scheduled to arrive for your surgery. Do not drink anything within 2 hours of your scheduled arrival time.  Type 1 and Type 2 diabetics should only drink water  Do not take any medication the day of surgery  Call Dr Nicholaus Bloom office today (10-08-21) to find out when your need to stop your aspirin EC 81 MG tablet  Do NOT take any Insulin the day of surgery  One week prior to surgery: Stop Anti-inflammatories (NSAIDS) such as Advil, Aleve, Ibuprofen, Motrin, Naproxen, Naprosyn and Aspirin based products such as Excedrin, Goodys Powder, BC Powder.You may however, continue to take Tylenol if needed for pain up until the day of surgery.  Stop ANY OVER THE COUNTER supplements/vitamins 7 days prior to surgery (Coenzyme Q10 (COQ10) 400 MG CAPS, magnesium oxide (MAG-OX) 400 MG tablet, Multiple Vitamin (MULTIVITAMIN WITH MINERALS) TABS, tabletPhosphatidylserine 100 MG CAPS)  No Alcohol for 24 hours before or after surgery.  No Smoking including e-cigarettes for 24 hours prior to surgery.  No chewable tobacco products for at least 6 hours prior to surgery.  No nicotine patches on the day of surgery.  Do not use any "recreational" drugs for at least a week prior to your surgery.  Please be advised that the combination of cocaine and anesthesia may have  negative outcomes, up to and including death. If you test positive for cocaine, your surgery will be cancelled.  On the morning of surgery brush your teeth with toothpaste and water, you may rinse your mouth with mouthwash if you wish. Do not swallow any toothpaste or mouthwash.  Use CHG Soap as directed on instruction sheet.  Do not wear jewelry, make-up, hairpins, clips or nail polish.  Do not wear lotions, powders, or perfumes.   Do not shave body from the neck down 48 hours prior to surgery just in case you cut yourself which could leave a site for infection.  Also, freshly shaved skin may become irritated if using the CHG soap.  Contact lenses, hearing aids and dentures may not be worn into surgery.  Do not bring valuables to the hospital. [facility] is not responsible for any missing/lost belongings or valuables.   Notify your doctor if there is any change in your medical condition (cold, fever, infection).  Wear comfortable clothing (specific to your surgery type) to the hospital.  After surgery, you can help prevent lung complications by doing breathing exercises.  Take deep breaths and cough every 1-2 hours. Your doctor may order a device called an Incentive Spirometer to help you take deep breaths. When coughing or sneezing, hold a pillow firmly against your incision with both hands. This is called "splinting." Doing this helps protect your incision. It also decreases belly discomfort.  If you are being admitted to the hospital overnight, leave your suitcase in the car. After surgery it may be  brought to your room.  If you are being discharged the day of surgery, you will not be allowed to drive home. You will need a responsible adult (18 years or older) to drive you home and stay with you that night.   If you are taking public transportation, you will need to have a responsible adult (18 years or older) with you. Please confirm with your physician that it is acceptable  to use public transportation.   Please call the [facility] Dept. at [phone] if you have any questions about these instructions.  Surgery Visitation Policy:  Patients undergoing a surgery or procedure may have one family member or support person with them as long as that person is not COVID-19 positive or experiencing its symptoms.  That person may remain in the waiting area during the procedure and may rotate out with other people.  Inpatient Visitation:    Visiting hours are 7 a.m. to 8 p.m. Up to two visitors ages 16+ are allowed at one time in a patient room. The visitors may rotate out with other people during the day. Visitors must check out when they leave, or other visitors will not be allowed. One designated support person may remain overnight. The visitor must pass COVID-19 screenings, use hand sanitizer when entering and exiting the patient's room and wear a mask at all times, including in the patient's room. Patients must also wear a mask when staff or their visitor are in the room. Masking is required regardless of vaccination status.
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
## 2021-10-12 ENCOUNTER — Encounter: Payer: Self-pay | Admitting: Surgery

## 2021-10-19 ENCOUNTER — Other Ambulatory Visit
Admission: RE | Admit: 2021-10-19 | Discharge: 2021-10-19 | Disposition: A | Discharge status: Home / Self Care | Payer: HMO | Source: Ambulatory Visit | Attending: Surgery | Admitting: Surgery

## 2021-10-19 ENCOUNTER — Other Ambulatory Visit: Payer: Self-pay

## 2021-10-19 DIAGNOSIS — Z01812 Encounter for preprocedural laboratory examination: Secondary | ICD-10-CM | POA: Diagnosis not present

## 2021-10-19 DIAGNOSIS — Z20822 Contact with and (suspected) exposure to covid-19: Secondary | ICD-10-CM | POA: Insufficient documentation

## 2021-10-20 ENCOUNTER — Ambulatory Visit
Admission: RE | Admit: 2021-10-20 | Discharge: 2021-10-20 | Disposition: A | Discharge status: Home / Self Care | Payer: HMO | Source: Ambulatory Visit | Attending: Surgery | Admitting: Surgery

## 2021-10-20 ENCOUNTER — Encounter: Payer: Self-pay | Admitting: Surgery

## 2021-10-20 ENCOUNTER — Other Ambulatory Visit: Payer: Self-pay

## 2021-10-20 ENCOUNTER — Inpatient Hospital Stay: Payer: HMO

## 2021-10-20 ENCOUNTER — Encounter
Admission: RE | Disposition: A | Discharge status: Home / Self Care | Payer: Self-pay | Source: Ambulatory Visit | Attending: Surgery

## 2021-10-20 ENCOUNTER — Inpatient Hospital Stay: Payer: HMO | Admitting: Urgent Care

## 2021-10-20 DIAGNOSIS — E782 Mixed hyperlipidemia: Secondary | ICD-10-CM | POA: Diagnosis not present

## 2021-10-20 DIAGNOSIS — Z87891 Personal history of nicotine dependence: Secondary | ICD-10-CM | POA: Insufficient documentation

## 2021-10-20 DIAGNOSIS — M6281 Muscle weakness (generalized): Secondary | ICD-10-CM | POA: Diagnosis not present

## 2021-10-20 DIAGNOSIS — E119 Type 2 diabetes mellitus without complications: Secondary | ICD-10-CM | POA: Diagnosis not present

## 2021-10-20 DIAGNOSIS — R2681 Unsteadiness on feet: Secondary | ICD-10-CM | POA: Insufficient documentation

## 2021-10-20 DIAGNOSIS — Z96651 Presence of right artificial knee joint: Secondary | ICD-10-CM | POA: Diagnosis not present

## 2021-10-20 DIAGNOSIS — R2689 Other abnormalities of gait and mobility: Secondary | ICD-10-CM | POA: Diagnosis not present

## 2021-10-20 DIAGNOSIS — Z471 Aftercare following joint replacement surgery: Secondary | ICD-10-CM | POA: Diagnosis not present

## 2021-10-20 DIAGNOSIS — M1711 Unilateral primary osteoarthritis, right knee: Secondary | ICD-10-CM | POA: Diagnosis not present

## 2021-10-20 HISTORY — PX: TOTAL KNEE ARTHROPLASTY: SHX125

## 2021-10-20 HISTORY — DX: Long term (current) use of insulin: Z79.4

## 2021-10-20 LAB — GLUCOSE, CAPILLARY
Glucose-Capillary: 117 mg/dL — ABNORMAL HIGH (ref 70–99)
Glucose-Capillary: 133 mg/dL — ABNORMAL HIGH (ref 70–99)

## 2021-10-20 LAB — SARS CORONAVIRUS 2 (TAT 6-24 HRS): SARS Coronavirus 2: NEGATIVE

## 2021-10-20 SURGERY — ARTHROPLASTY, KNEE, TOTAL
Anesthesia: Monitor Anesthesia Care | Site: Knee | Laterality: Right

## 2021-10-20 MED ORDER — ACETAMINOPHEN 500 MG PO TABS: 1000.0000 mg | ORAL_TABLET | Freq: Four times a day (QID) | ORAL | Status: DC

## 2021-10-20 MED ORDER — LIDOCAINE HCL (PF) 2 % IJ SOLN
INTRAMUSCULAR | Status: AC
  Filled 2021-10-20: qty 5

## 2021-10-20 MED ORDER — 0.9 % SODIUM CHLORIDE (POUR BTL) OPTIME
TOPICAL | Status: DC | PRN
  Administered 2021-10-20: 1000 mL

## 2021-10-20 MED ORDER — EPHEDRINE SULFATE 50 MG/ML IJ SOLN
INTRAMUSCULAR | Status: DC | PRN
  Administered 2021-10-20 (×3): 5 mg via INTRAVENOUS
  Administered 2021-10-20: 10 mg via INTRAVENOUS

## 2021-10-20 MED ORDER — ONDANSETRON HCL 4 MG/2ML IJ SOLN
INTRAMUSCULAR | Status: AC
  Filled 2021-10-20: qty 2

## 2021-10-20 MED ORDER — SEVOFLURANE IN SOLN
RESPIRATORY_TRACT | Status: AC
  Filled 2021-10-20: qty 250

## 2021-10-20 MED ORDER — CEFAZOLIN SODIUM-DEXTROSE 2-4 GM/100ML-% IV SOLN
2.0000 g | Freq: Four times a day (QID) | INTRAVENOUS | Status: DC
  Administered 2021-10-20: 2 g via INTRAVENOUS

## 2021-10-20 MED ORDER — PROPOFOL 10 MG/ML IV BOLUS
INTRAVENOUS | Status: DC | PRN
  Administered 2021-10-20: 20 mg via INTRAVENOUS
  Administered 2021-10-20: 30 mg via INTRAVENOUS

## 2021-10-20 MED ORDER — SODIUM CHLORIDE FLUSH 0.9 % IV SOLN
INTRAVENOUS | Status: AC
  Filled 2021-10-20: qty 40

## 2021-10-20 MED ORDER — ACETAMINOPHEN 10 MG/ML IV SOLN: 1000.0000 mg | Freq: Once | INTRAVENOUS | Status: DC | PRN

## 2021-10-20 MED ORDER — PHENYLEPHRINE HCL-NACL 20-0.9 MG/250ML-% IV SOLN
INTRAVENOUS | Status: DC | PRN
  Administered 2021-10-20: 40 ug/min via INTRAVENOUS

## 2021-10-20 MED ORDER — PHENYLEPHRINE HCL-NACL 20-0.9 MG/250ML-% IV SOLN
INTRAVENOUS | Status: AC
  Filled 2021-10-20: qty 250

## 2021-10-20 MED ORDER — CHLORHEXIDINE GLUCONATE 0.12 % MT SOLN
15.0000 mL | Freq: Once | OROMUCOSAL | Status: AC
  Administered 2021-10-20: 15 mL via OROMUCOSAL

## 2021-10-20 MED ORDER — FAMOTIDINE 20 MG PO TABS
20.0000 mg | ORAL_TABLET | Freq: Once | ORAL | Status: AC
  Administered 2021-10-20: 20 mg via ORAL

## 2021-10-20 MED ORDER — BUPIVACAINE HCL (PF) 0.5 % IJ SOLN
INTRAMUSCULAR | Status: DC | PRN
  Administered 2021-10-20: 2.5 mL

## 2021-10-20 MED ORDER — APIXABAN 2.5 MG PO TABS: 2.5000 mg | ORAL_TABLET | Freq: Two times a day (BID) | ORAL | 0 refills | Status: AC

## 2021-10-20 MED ORDER — SODIUM CHLORIDE 0.9 % BOLUS PEDS
250.0000 mL | Freq: Once | INTRAVENOUS | Status: AC
  Administered 2021-10-20: 250 mL via INTRAVENOUS

## 2021-10-20 MED ORDER — SODIUM CHLORIDE 0.9 % IV SOLN: INTRAVENOUS | Status: DC | PRN

## 2021-10-20 MED ORDER — TRANEXAMIC ACID 1000 MG/10ML IV SOLN
INTRAVENOUS | Status: AC
  Filled 2021-10-20: qty 10

## 2021-10-20 MED ORDER — KETOROLAC TROMETHAMINE 15 MG/ML IJ SOLN: 7.5000 mg | Freq: Four times a day (QID) | INTRAMUSCULAR | Status: DC

## 2021-10-20 MED ORDER — ONDANSETRON HCL 4 MG/2ML IJ SOLN: 4.0000 mg | Freq: Four times a day (QID) | INTRAMUSCULAR | Status: DC | PRN

## 2021-10-20 MED ORDER — OXYCODONE HCL 5 MG PO TABS
5.0000 mg | ORAL_TABLET | ORAL | Status: DC | PRN
  Administered 2021-10-20: 5 mg via ORAL

## 2021-10-20 MED ORDER — METOCLOPRAMIDE HCL 10 MG PO TABS: 5.0000 mg | ORAL_TABLET | Freq: Three times a day (TID) | ORAL | Status: DC | PRN

## 2021-10-20 MED ORDER — EPHEDRINE 5 MG/ML INJ
INTRAVENOUS | Status: AC
  Filled 2021-10-20: qty 10

## 2021-10-20 MED ORDER — BUPIVACAINE-EPINEPHRINE (PF) 0.5% -1:200000 IJ SOLN
INTRAMUSCULAR | Status: AC
  Filled 2021-10-20: qty 30

## 2021-10-20 MED ORDER — ONDANSETRON HCL 4 MG PO TABS: 4.0000 mg | ORAL_TABLET | Freq: Four times a day (QID) | ORAL | Status: DC | PRN

## 2021-10-20 MED ORDER — ONDANSETRON HCL 4 MG/2ML IJ SOLN
INTRAMUSCULAR | Status: DC | PRN
  Administered 2021-10-20: 4 mg via INTRAVENOUS

## 2021-10-20 MED ORDER — KETOROLAC TROMETHAMINE 15 MG/ML IJ SOLN
15.0000 mg | Freq: Once | INTRAMUSCULAR | Status: AC
  Administered 2021-10-20: 15 mg via INTRAVENOUS

## 2021-10-20 MED ORDER — DEXAMETHASONE SODIUM PHOSPHATE 10 MG/ML IJ SOLN
INTRAMUSCULAR | Status: AC
  Filled 2021-10-20: qty 1

## 2021-10-20 MED ORDER — DEXAMETHASONE SODIUM PHOSPHATE 10 MG/ML IJ SOLN
INTRAMUSCULAR | Status: DC | PRN
  Administered 2021-10-20: 10 mg via INTRAVENOUS

## 2021-10-20 MED ORDER — SODIUM CHLORIDE (PF) 0.9 % IJ SOLN
INTRAMUSCULAR | Status: DC | PRN
  Administered 2021-10-20: 90 mL

## 2021-10-20 MED ORDER — SODIUM CHLORIDE 0.9 % IR SOLN
Status: DC | PRN
  Administered 2021-10-20: 3000 mL

## 2021-10-20 MED ORDER — CEFAZOLIN SODIUM-DEXTROSE 2-4 GM/100ML-% IV SOLN
2.0000 g | INTRAVENOUS | Status: AC
  Administered 2021-10-20: 2 g via INTRAVENOUS

## 2021-10-20 MED ORDER — KETOROLAC TROMETHAMINE 15 MG/ML IJ SOLN
INTRAMUSCULAR | Status: AC
  Filled 2021-10-20: qty 1

## 2021-10-20 MED ORDER — PROPOFOL 1000 MG/100ML IV EMUL
INTRAVENOUS | Status: AC
  Filled 2021-10-20: qty 100

## 2021-10-20 MED ORDER — ACETAMINOPHEN 10 MG/ML IV SOLN
INTRAVENOUS | Status: DC | PRN
  Administered 2021-10-20: 1000 mg via INTRAVENOUS

## 2021-10-20 MED ORDER — METOCLOPRAMIDE HCL 5 MG/ML IJ SOLN: 5.0000 mg | Freq: Three times a day (TID) | INTRAMUSCULAR | Status: DC | PRN

## 2021-10-20 MED ORDER — FENTANYL CITRATE (PF) 100 MCG/2ML IJ SOLN: 25.0000 ug | INTRAMUSCULAR | Status: DC | PRN

## 2021-10-20 MED ORDER — FENTANYL CITRATE (PF) 100 MCG/2ML IJ SOLN
INTRAMUSCULAR | Status: DC | PRN
  Administered 2021-10-20 (×3): 25 ug via INTRAVENOUS

## 2021-10-20 MED ORDER — OXYCODONE HCL 5 MG PO TABS
5.0000 mg | ORAL_TABLET | Freq: Once | ORAL | Status: AC | PRN
  Administered 2021-10-20: 5 mg via ORAL

## 2021-10-20 MED ORDER — OXYCODONE HCL 5 MG/5ML PO SOLN: 5.0000 mg | Freq: Once | ORAL | Status: AC | PRN

## 2021-10-20 MED ORDER — SODIUM CHLORIDE 0.9 % IV SOLN: INTRAVENOUS | Status: DC

## 2021-10-20 MED ORDER — LISINOPRIL 5 MG PO TABS: 5.0000 mg | ORAL_TABLET | Freq: Every evening | ORAL | Status: AC

## 2021-10-20 MED ORDER — ACETAMINOPHEN 10 MG/ML IV SOLN
INTRAVENOUS | Status: AC
  Filled 2021-10-20: qty 100

## 2021-10-20 MED ORDER — ORAL CARE MOUTH RINSE: 15.0000 mL | Freq: Once | OROMUCOSAL | Status: AC

## 2021-10-20 MED ORDER — BUPIVACAINE LIPOSOME 1.3 % IJ SUSP
INTRAMUSCULAR | Status: AC
  Filled 2021-10-20: qty 20

## 2021-10-20 MED ORDER — PROPOFOL 500 MG/50ML IV EMUL
INTRAVENOUS | Status: DC | PRN
  Administered 2021-10-20: 75 ug/kg/min via INTRAVENOUS

## 2021-10-20 MED ORDER — OXYCODONE HCL 5 MG PO TABS: 5.0000 mg | ORAL_TABLET | ORAL | 0 refills | Status: AC | PRN

## 2021-10-20 MED ORDER — FENTANYL CITRATE (PF) 100 MCG/2ML IJ SOLN
INTRAMUSCULAR | Status: AC
  Filled 2021-10-20: qty 2

## 2021-10-20 SURGICAL SUPPLY — 65 items
APL PRP STRL LF DISP 70% ISPRP (MISCELLANEOUS) ×2
BIT DRILL QUICK REL 1/8 2PK SL (DRILL) ×3 IMPLANT
BLADE SAW SAG 25X90X1.19 (BLADE) ×2 IMPLANT
BLADE SURG SZ20 CARB STEEL (BLADE) ×2 IMPLANT
BNDG CMPR STD VLCR NS LF 5.8X6 (GAUZE/BANDAGES/DRESSINGS) ×1
BNDG ELASTIC 6X5.8 VLCR NS LF (GAUZE/BANDAGES/DRESSINGS) ×2 IMPLANT
BRNG TIB 0D 79X14 ANT STAB (Insert) ×1 IMPLANT
CEMENT BONE R 1X40 (Cement) ×4 IMPLANT
CEMENT VACUUM MIXING SYSTEM (MISCELLANEOUS) ×2 IMPLANT
CHLORAPREP W/TINT 26 (MISCELLANEOUS) ×4 IMPLANT
COMP FEMORAL CRUC RIGHT 72.5 (Joint) ×2 IMPLANT
COMPONENT FEMRL CRUC RT 72.5 (Joint) ×1 IMPLANT
COOLER POLAR GLACIER W/PUMP (MISCELLANEOUS) ×2 IMPLANT
COVER MAYO STAND REUSABLE (DRAPES) ×2 IMPLANT
CUFF TOURN SGL QUICK 24 (TOURNIQUET CUFF)
CUFF TOURN SGL QUICK 34 (TOURNIQUET CUFF)
CUFF TRNQT CYL 24X4X16.5-23 (TOURNIQUET CUFF) IMPLANT
CUFF TRNQT CYL 34X4.125X (TOURNIQUET CUFF) IMPLANT
DRAPE 3/4 80X56 (DRAPES) ×2 IMPLANT
DRAPE IMP U-DRAPE 54X76 (DRAPES) ×2 IMPLANT
DRAPE INCISE 23X17 IOBAN STRL (DRAPES)
DRAPE INCISE IOBAN 23X17 STRL (DRAPES) IMPLANT
DRAPE INCISE IOBAN 66X45 STRL (DRAPES) IMPLANT
DRILL QUICK RELEASE 1/8 INCH (DRILL) ×3
DRSG MEPILEX SACRM 8.7X9.8 (GAUZE/BANDAGES/DRESSINGS) ×2 IMPLANT
DRSG OPSITE POSTOP 4X10 (GAUZE/BANDAGES/DRESSINGS) IMPLANT
DRSG OPSITE POSTOP 4X8 (GAUZE/BANDAGES/DRESSINGS) ×2 IMPLANT
ELECT REM PT RETURN 9FT ADLT (ELECTROSURGICAL) ×2
ELECTRODE REM PT RTRN 9FT ADLT (ELECTROSURGICAL) ×1 IMPLANT
GAUZE 4X4 16PLY ~~LOC~~+RFID DBL (SPONGE) IMPLANT
GAUZE XEROFORM 1X8 LF (GAUZE/BANDAGES/DRESSINGS) ×2 IMPLANT
GLOVE SRG 8 PF TXTR STRL LF DI (GLOVE) ×1 IMPLANT
GLOVE SURG ENC MOIS LTX SZ7.5 (GLOVE) ×8 IMPLANT
GLOVE SURG ENC MOIS LTX SZ8 (GLOVE) ×8 IMPLANT
GLOVE SURG UNDER LTX SZ8 (GLOVE) ×2 IMPLANT
GLOVE SURG UNDER POLY LF SZ8 (GLOVE) ×2
GOWN STRL REUS W/ TWL LRG LVL3 (GOWN DISPOSABLE) ×2 IMPLANT
GOWN STRL REUS W/ TWL XL LVL3 (GOWN DISPOSABLE) ×1 IMPLANT
GOWN STRL REUS W/TWL LRG LVL3 (GOWN DISPOSABLE) ×4
GOWN STRL REUS W/TWL XL LVL3 (GOWN DISPOSABLE) ×2
INSERT TIBIAL BEARING 79X14 (Insert) ×2 IMPLANT
IV NS IRRIG 3000ML ARTHROMATIC (IV SOLUTION) ×2 IMPLANT
KIT TURNOVER KIT A (KITS) ×2 IMPLANT
MANIFOLD NEPTUNE II (INSTRUMENTS) ×2 IMPLANT
NEEDLE SPNL 20GX3.5 QUINCKE YW (NEEDLE) ×2 IMPLANT
NS IRRIG 1000ML POUR BTL (IV SOLUTION) ×2 IMPLANT
PACK TOTAL KNEE (MISCELLANEOUS) ×2 IMPLANT
PAD WRAPON POLAR KNEE (MISCELLANEOUS) ×1 IMPLANT
PATELLA STD 34X8.5 (Orthopedic Implant) ×2 IMPLANT
PLATE KNEE TIBIAL 79MM FIXED (Plate) ×2 IMPLANT
PULSAVAC PLUS IRRIG FAN TIP (DISPOSABLE) ×2
SPONGE T-LAP 18X18 ~~LOC~~+RFID (SPONGE) ×6 IMPLANT
STAPLER SKIN PROX 35W (STAPLE) ×2 IMPLANT
SUCTION FRAZIER HANDLE 10FR (MISCELLANEOUS) ×1
SUCTION TUBE FRAZIER 10FR DISP (MISCELLANEOUS) ×1 IMPLANT
SUT VIC AB 0 CT1 36 (SUTURE) ×6 IMPLANT
SUT VIC AB 2-0 CT1 27 (SUTURE) ×6
SUT VIC AB 2-0 CT1 TAPERPNT 27 (SUTURE) ×3 IMPLANT
SYR 10ML LL (SYRINGE) ×2 IMPLANT
SYR 20ML LL LF (SYRINGE) IMPLANT
SYR 30ML LL (SYRINGE) IMPLANT
TIP FAN IRRIG PULSAVAC PLUS (DISPOSABLE) ×1 IMPLANT
TRAP FLUID SMOKE EVACUATOR (MISCELLANEOUS) ×2 IMPLANT
WATER STERILE IRR 500ML POUR (IV SOLUTION) ×2 IMPLANT
WRAPON POLAR PAD KNEE (MISCELLANEOUS) ×2

## 2021-10-20 NOTE — Anesthesia Postprocedure Evaluation (Signed)
Anesthesia Post Note  Patient: Antonio Edward Sr.  Procedure(s) Performed: TOTAL KNEE ARTHROPLASTY (Right: Knee)  Patient location during evaluation: PACU Anesthesia Type: Combined General/Spinal Level of consciousness: awake and alert Pain management: pain level controlled Vital Signs Assessment: post-procedure vital signs reviewed and stable Respiratory status: spontaneous breathing, nonlabored ventilation and respiratory function stable Cardiovascular status: blood pressure returned to baseline and stable Postop Assessment: no apparent nausea or vomiting and spinal receding Anesthetic complications: no   No notable events documented.   Last Vitals:  Vitals:   10/20/21 1142 10/20/21 1314  BP: 122/81 (P) 135/65  Pulse: 67 (P) 67  Resp: 18 (P) 18  Temp: 36.7 C   SpO2: 97% (P) 96%    Last Pain:  Vitals:   10/20/21 1247  TempSrc:   PainSc: 3                  [place] Ouch

## 2021-10-20 NOTE — Transfer of Care (Signed)
Immediate Anesthesia Transfer of Care Note  Patient: Antonio Edward Sr.  Procedure(s) Performed: TOTAL KNEE ARTHROPLASTY (Right: Knee)  Patient Location: PACU  Anesthesia Type:General and Spinal  Level of Consciousness: awake, alert  and oriented  Airway & Oxygen Therapy: Patient Spontanous Breathing  Post-op Assessment: Report given to RN and Post -op Vital signs reviewed and stable  Post vital signs: Reviewed and stable  Last Vitals:  Vitals Value Taken Time  BP    Temp    Pulse    Resp    SpO2      Last Pain:  Vitals:   10/20/21 0643  TempSrc: Temporal  PainSc: 0-No pain      Patients Stated Pain Goal: 0 (68/25/74 9355)  Complications: No notable events documented.

## 2021-10-20 NOTE — TOC Progression Note (Addendum)
Transition of Care (TOC) - Progression Note    Patient Details  Name: Antonio URSIN Sr. MRN: [id number] Date of Birth: Nov 22, 1941  Transition of Care [facility]) CM/SW [place], RN Phone Number: 10/20/2021, 10:09 AM  Clinical Narrative:     [facility] is unable to accept the patient  Reached out to [facility], They are unable to accept the patient Reached out to [facility] with [facility] requesting [facility] PT, unable to accept the patient Reached out to [facility] with [facility] requesting [facility] PT, unable to accept the patient Reached out to [place] with [facility] requesting [facility] PT, unable to accept the patient Reached out to [place] with [facility] requesting [place] PT, They are ONN with [facility] and unable to get a single case agreement,      Expected Discharge Plan and Services                                                 Social Determinants of Health (SDOH) Interventions    Readmission Risk Interventions No flowsheet data found.

## 2021-10-20 NOTE — Op Note (Signed)
10/20/2021  10:05 AM  Patient:   Antonio Edward Sr.  Pre-Op Diagnosis:   Degenerative joint disease, right knee.  Post-Op Diagnosis:   Same  Procedure:   Right TKA using all-cemented Bi[employer] Vanguard system with a 72.5 mm PCR femur, a 79 mm tibial tray with a 14 mm anterior stabilized E-poly insert, and a 34 x 8.5 mm all-poly 3-pegged domed patella.  Surgeon:   Pascal Lux, MD  Assistant:   Cameron Proud, PA-C   Anesthesia:   Spinal  Findings:   As above  Complications:   None  EBL:   10 cc  Fluids:   850 cc crystalloid  UOP:   None  TT:   85 minutes at 300 mmHg  Drains:   None  Closure:   Staples  Implants:   As above  Brief Clinical Note:   The patient is an 80 year old male with a long history of progressively worsening right knee pain. The patient's symptoms have progressed despite medications, activity modification, injections, etc. The patient's history and examination were consistent with advanced degenerative joint disease of the right knee confirmed by plain radiographs. The patient presents at this time for a right total knee arthroplasty.  Procedure:   The patient was brought into the operating room. After adequate spinal anesthesia was obtained, the patient was lain in the supine position before the right lower extremity was prepped with ChloraPrep solution and draped sterilely. Preoperative antibiotics were administered. After verifying the proper laterality with a surgical timeout, the limb was exsanguinated with an Esmarch and the tourniquet inflated to 300 mmHg. A standard anterior approach to the knee was made through an approximately 7 inch incision. The incision was carried down through the subcutaneous tissues to expose superficial retinaculum. This was split the length of the incision and the medial flap elevated sufficiently to expose the medial retinaculum. The medial retinaculum was incised, leaving a 3-4 mm cuff of tissue on the patella. This was  extended distally along the medial border of the patellar tendon and proximally through the medial third of the quadriceps tendon. A subtotal fat pad excision was performed before the soft tissues were elevated off the anteromedial and anterolateral aspects of the proximal tibia to the level of the collateral ligaments. The anterior portions of the medial and lateral menisci were removed, as was the anterior cruciate ligament. With the knee flexed to 90, the external tibial guide was positioned and the appropriate proximal tibial cut made. This piece was taken to the back table where it was measured and found to be optimally replicated by a 79 mm component.  Attention was directed to the distal femur. The intramedullary canal was accessed through a 3/8" drill hole. The intramedullary guide was inserted and positioned in order to obtain a neutral flexion gap. The intercondylar block was positioned with care taken to avoid notching the anterior cortex of the femur. The appropriate cut was made. Next, the distal cutting block was placed at 6 of valgus alignment. Using the 9 mm slot, the distal cut was made. The distal femur was measured and found to be optimally replicated by the 36.4 mm component. The 72.5 mm 4-in-1 cutting block was positioned and first the posterior, then the posterior chamfer, the anterior chamfer, and finally the anterior cuts were made. At this point, the posterior portions medial and lateral menisci were removed. A trial reduction was performed using the appropriate femoral and tibial components with first the 10 mm, then the 12 mm,  and finally the 14 mm insert. The 14 mm insert demonstrated excellent stability to varus and valgus stressing both in flexion and extension while permitting full extension. Patella tracking was assessed and found to be excellent. Therefore, the tibial guide position was marked on the proximal tibia. The patella thickness was measured and found to be 26 mm.  Therefore, the appropriate cut was made. The patellar surface was measured and found to be optimally replicated by the 34 mm component. The three peg holes were drilled in place before the trial button was inserted. Patella tracking was assessed and found to be excellent, passing the "no thumb test". The lug holes were drilled into the distal femur before the trial component was removed, leaving only the tibial tray. The keel was then created using the appropriate tower, reamer, and punch.  The bony surfaces were prepared for cementing by irrigating them thoroughly with sterile saline solution via the jet lavage system. A bone plug was fashioned from some of the bone that had been removed previously and used to plug the distal femoral canal. In addition, 20 cc of Exparel diluted out to 60 cc with normal saline and 30 cc of 0.5% Sensorcaine were injected into the postero-medial and postero-lateral aspects of the knee, the medial and lateral gutter regions, and the peri-incisional tissues to help with postoperative analgesia. Meanwhile, the cement was being mixed on the back table. When it was ready, the tibial tray was cemented in first. The excess cement was removed using [employer] fast streamer. Next, the femoral component was impacted into place. Again, the excess cement was removed using [employer] fast streamer. The 14 mm trial insert was positioned and the knee brought into extension while the cement hardened. Finally, the patella was cemented into place and secured using the patellar clamp. Again, the excess cement was removed using [employer] fast streamer. Once the cement had hardened, the knee was placed through a range of motion with the findings as described above. Therefore, the trial insert was removed and, after verifying that no cement had been retained posteriorly, the permanent 14 mm anterior stabilized E-polyethylene insert was positioned and secured using the appropriate key locking mechanism. Again the knee was placed  through a range of motion with the findings as described above.  The wound was copiously irrigated with sterile saline solution using the jet lavage system before the quadriceps tendon and retinacular layer were reapproximated using #0 Vicryl interrupted sutures. The superficial retinacular layer also was closed using a running #0 Vicryl suture. A total of 10 cc of transexemic acid (TXA) was injected intra-articularly before the subcutaneous tissues were closed in several layers using 2-0 Vicryl interrupted sutures. The skin was closed using staples. A sterile honeycomb dressing was applied to the skin before the leg was wrapped with an Ace wrap to accommodate the Polar Care device. The patient was then awakened and returned to the recovery room in satisfactory condition after tolerating the procedure well.

## 2021-10-20 NOTE — TOC Progression Note (Addendum)
Transition of Care (TOC) - Progression Note    Patient Details  Name: Antonio Villarreal. MRN: [id number] Date of Birth: 07-27-1941  Transition of Care [facility]) CM/SW [place], RN Phone Number: 10/20/2021, 11:24 AM  Clinical Narrative:   Met with the patient at the bedside, He lives at home with his wife, His daughter is in from out of town to help as well.  He is already set up with [facility] outpatient PT with an appointment, Confirmed appointment is 12/14 at 2 PM.  He has a RW and a 3 in 1 at home, His house is handicapped accessible as well.  He can afford his medication, He has no additional needs at this time,  [facility] has reached back out and agreed to accept the patient for [facility] PT,        Expected Discharge Plan and Services                                                 Social Determinants of Health (SDOH) Interventions    Readmission Risk Interventions No flowsheet data found.
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
## 2021-10-20 NOTE — Anesthesia Procedure Notes (Addendum)
Spinal  Patient location during procedure: OR Start time: 10/20/2021 7:29 AM End time: 10/20/2021 7:52 AM Reason for block: surgical anesthesia Staffing Performed: resident/CRNA and other anesthesia staff  Resident/CRNA: Demetrius Charity, CRNA Other anesthesia staff: Justin Mend, RN Preanesthetic Checklist Completed: patient identified, IV checked, site marked, risks and benefits discussed, surgical consent, monitors and equipment checked, pre-op evaluation and timeout performed Spinal Block Patient position: sitting Prep: Betadine Patient monitoring: heart rate, continuous pulse ox, blood pressure and cardiac monitor Approach: midline Location: L4-5 Injection technique: single-shot Needle Needle type: Introducer and Pencan  Needle gauge: 25 G Needle length: 9 cm Assessment Events: CSF return Additional Notes Negative paresthesia. Negative blood return. Positive free-flowing CSF. Expiration date of kit checked and confirmed. Patient tolerated procedure well, without complications.
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
## 2021-10-20 NOTE — Discharge Instructions (Addendum)
Orthopedic discharge instructions: May shower with intact OpSite dressing. Apply ice frequently to knee or use Polar Care device. Start Eliquis 2.5 mg twice daily for 2 weeks as of Wednesday, 10/21/2021, then take aspirin 325 mg daily for 4 weeks. Take pain medication as prescribed or ES Tylenol when needed.  May weight-bear as tolerated on right leg - use crutches or walker for balance and support. Follow-up in 10-14 days or as scheduled.AMBULATORY SURGERY  DISCHARGE INSTRUCTIONS   The drugs that you were given will stay in your system until tomorrow so for the next 24 hours you should not:  Drive an automobile Make any legal decisions Drink any alcoholic beverage   You may resume regular meals tomorrow.  Today it is better to start with liquids and gradually work up to solid foods.  You may eat anything you prefer, but it is better to start with liquids, then soup and crackers, and gradually work up to solid foods.   Please notify your doctor immediately if you have any unusual bleeding, trouble breathing, redness and pain at the surgery site, drainage, fever, or pain not relieved by medication.    Additional Instructions:        Please contact your physician with any problems or Same Day Surgery at [phone], Monday through Friday 6 am to 4 pm, or [place] [facility] number at [phone].

## 2021-10-20 NOTE — Evaluation (Signed)
Physical Therapy Evaluation Patient Details Name: Antonio Villarreal Sr. MRN: [id number] DOB: Jan 06, 1941 Today's Date: 10/20/2021  History of Present Illness  80 y.o. male s/p R TKA. PMH significant for HTN, prior smoker, CABG, DM, infrarenal abdominal aorta and L unicompartmental knee replacement in July 2022.  Clinical Impression  Pt received supine in bed; daughter joined session as mobility/exercises were initiated. Pt lives in a single story home with his wife whom he typically is a caregiver for. His daughter is in town from [place] for 1 week to assist post-surgery. Pt owns necessary DME from previous knee replacement.   Pt performed bed mobility Mod I. STS, ambulation and stairs with CGA for safety - no LOB or knee buckling within session. Pt  education provided on cryotherapy, propping heel on foam/towels to encourage extension and 2 methods of ambulating the stairs (pt could not remember if he could reach bilateral railings at the same time - daughter later confirmed he could). Therex in packet were also completed. Pt with good retention of knowledge from previous knee replacement. PT recommending HHPT as pt and daughter have concerns of transportation once daughter goes home. Would benefit from skilled PT to address above deficits and promote optimal return to PLOF.       Recommendations for follow up therapy are one component of a multi-disciplinary discharge planning process, led by the attending physician.  Recommendations may be updated based on patient status, additional functional criteria and insurance authorization.  Follow Up Recommendations Home health PT    Assistance Recommended at Discharge Intermittent Supervision/Assistance  Functional Status Assessment Patient has had a recent decline in their functional status and demonstrates the ability to make significant improvements in function in a reasonable and predictable amount of time.  Equipment Recommendations  None  recommended by PT    Recommendations for Other Services       Precautions / Restrictions Precautions Precautions: None Restrictions Weight Bearing Restrictions: Yes RLE Weight Bearing: Weight bearing as tolerated      Mobility  Bed Mobility Overal bed mobility: Modified Independent                  Transfers Overall transfer level: Needs assistance Equipment used: Rolling walker (2 wheels) Transfers: Sit to/from Stand Sit to Stand: Supervision           General transfer comment: SUP for safety. VC on hand placement.    Ambulation/Gait Ambulation/Gait assistance: Min guard Gait Distance (Feet): 80 Feet Assistive device: Rolling walker (2 wheels) Gait Pattern/deviations: Step-through pattern;Decreased stance time - right Gait velocity: decreased     General Gait Details: 22ft>stairs>40ft. CGA provided for safety. No knee buckling/LOB. Decreased weightshift onto RLE  Stairs Stairs: Yes Stairs assistance: Min guard Stair Management: Two rails;Step to pattern;Forwards Number of Stairs: 4 General stair comments: Pt stated and performed correct stepping pattern w/o PT educating on pattern (good carryover from previous knee replacement).  Wheelchair Mobility    Modified Rankin (Stroke Patients Only)       Balance Overall balance assessment: Needs assistance Sitting-balance support: No upper extremity supported;Feet supported Sitting balance-Leahy Scale: Good     Standing balance support: Bilateral upper extremity supported;During functional activity Standing balance-Leahy Scale: Fair Standing balance comment: Utilized RW in standing. Did not rely on RW during static statnding however did rely during ambulation.                             Pertinent  Vitals/Pain Pain Assessment: Faces Faces Pain Scale: Hurts little more Pain Location: R knee (during WB) Pain Descriptors / Indicators: Aching;Discomfort Pain Intervention(s): Limited  activity within patient's tolerance;Monitored during session    [place] expects to be discharged to:: Private residence Living Arrangements: Spouse/significant other Available Help at Discharge: Family (daughter in town for 1 week. Wife unable to provide physical assistance.) Type of Home: House Home Access: Stairs to enter Entrance Stairs-Rails: Left;Right;Can reach both Entrance Stairs-Number of Steps: 3+1   Home Layout: One level Home Equipment: Grab bars - toilet;Rolling Walker (2 wheels);BSC/3in1;Grab bars - tub/shower;Shower seat;Wheelchair - manual;Cane - single point      Prior Function Prior Level of Function : Independent/Modified Independent;Driving             Mobility Comments: Pt fully independent at baseline. Drives. No falls. Is caregiver for wife.       Hand Dominance        Extremity/Trunk Assessment   Upper Extremity Assessment Upper Extremity Assessment: Overall WFL for tasks assessed    Lower Extremity Assessment Lower Extremity Assessment: RLE deficits/detail RLE Deficits / Details: s/p R TKA. LLE WFL.       Communication   Communication: No difficulties  Cognition Arousal/Alertness: Awake/alert Behavior During Therapy: WFL for tasks assessed/performed Overall Cognitive Status: Within Functional Limits for tasks assessed                                          General Comments      Exercises Total Joint Exercises Ankle Circles/Pumps: AROM;10 reps;Supine;Right Quad Sets: AROM;Strengthening;Right;10 reps;Supine Short Arc Quad: AROM;Strengthening;Right;10 reps;Supine Heel Slides: AROM;Strengthening;Right;10 reps;Supine Hip ABduction/ADduction: AROM;Strengthening;Right;10 reps;Supine Straight Leg Raises: AAROM;Strengthening;Right;10 reps;Supine Long Arc Quad: AROM;Strengthening;Right;10 reps;Seated   Assessment/Plan    PT Assessment Patient needs continued PT services  PT Problem List Decreased  strength;Decreased mobility;Decreased range of motion;Decreased activity tolerance;Decreased balance       PT Treatment Interventions DME instruction;Therapeutic activities;Gait training;Therapeutic exercise;Patient/family education;Stair training;Balance training;Functional mobility training;Neuromuscular re-education    PT Goals (Current goals can be found in the Care Plan section)  Acute Rehab PT Goals Patient Stated Goal: to recover fully PT Goal Formulation: With patient Time For Goal Achievement: 11/03/21 Potential to Achieve Goals: Good    Frequency BID   Barriers to discharge        Co-evaluation               AM-PAC PT "6 Clicks" Mobility  Outcome Measure Help needed turning from your back to your side while in a flat bed without using bedrails?: None Help needed moving from lying on your back to sitting on the side of a flat bed without using bedrails?: None Help needed moving to and from a bed to a chair (including a wheelchair)?: A Little Help needed standing up from a chair using your arms (e.g., wheelchair or bedside chair)?: A Little Help needed to walk in hospital room?: A Little Help needed climbing 3-5 steps with a railing? : A Little 6 Click Score: 20    End of Session Equipment Utilized During Treatment: Gait belt Activity Tolerance: Patient tolerated treatment well Patient left: in bed;with call bell/phone within reach;with family/visitor present Nurse Communication: Mobility status PT Visit Diagnosis: Unsteadiness on feet (R26.81);Other abnormalities of gait and mobility (R26.89);Muscle weakness (generalized) (M62.81);Difficulty in walking, not elsewhere classified (R26.2)    Time: 1330-1404 PT Time Calculation (min) (  ACUTE ONLY): 34 min   Charges:   PT Evaluation $PT Eval Moderate Complexity: 1 Mod PT Treatments $Therapeutic Exercise: 8-22 mins $Therapeutic Activity: 8-22 mins        Patrina Levering PT, DPT 10/20/21 2:43  PM [phone]

## 2021-10-21 ENCOUNTER — Encounter: Payer: Self-pay | Admitting: Surgery

## 2021-10-24 DIAGNOSIS — I6523 Occlusion and stenosis of bilateral carotid arteries: Secondary | ICD-10-CM | POA: Diagnosis not present

## 2021-10-24 DIAGNOSIS — M7522 Bicipital tendinitis, left shoulder: Secondary | ICD-10-CM | POA: Diagnosis not present

## 2021-10-24 DIAGNOSIS — Z794 Long term (current) use of insulin: Secondary | ICD-10-CM | POA: Diagnosis not present

## 2021-10-24 DIAGNOSIS — Z96661 Presence of right artificial ankle joint: Secondary | ICD-10-CM | POA: Diagnosis not present

## 2021-10-24 DIAGNOSIS — Z955 Presence of coronary angioplasty implant and graft: Secondary | ICD-10-CM | POA: Diagnosis not present

## 2021-10-24 DIAGNOSIS — Z87891 Personal history of nicotine dependence: Secondary | ICD-10-CM | POA: Diagnosis not present

## 2021-10-24 DIAGNOSIS — Z951 Presence of aortocoronary bypass graft: Secondary | ICD-10-CM | POA: Diagnosis not present

## 2021-10-24 DIAGNOSIS — Z79891 Long term (current) use of opiate analgesic: Secondary | ICD-10-CM | POA: Diagnosis not present

## 2021-10-24 DIAGNOSIS — Z96652 Presence of left artificial knee joint: Secondary | ICD-10-CM | POA: Diagnosis not present

## 2021-10-24 DIAGNOSIS — F32A Depression, unspecified: Secondary | ICD-10-CM | POA: Diagnosis not present

## 2021-10-24 DIAGNOSIS — I714 Abdominal aortic aneurysm, without rupture, unspecified: Secondary | ICD-10-CM | POA: Diagnosis not present

## 2021-10-24 DIAGNOSIS — Z9181 History of falling: Secondary | ICD-10-CM | POA: Diagnosis not present

## 2021-10-24 DIAGNOSIS — R32 Unspecified urinary incontinence: Secondary | ICD-10-CM | POA: Diagnosis not present

## 2021-10-24 DIAGNOSIS — Z96651 Presence of right artificial knee joint: Secondary | ICD-10-CM | POA: Diagnosis not present

## 2021-10-24 DIAGNOSIS — E782 Mixed hyperlipidemia: Secondary | ICD-10-CM | POA: Diagnosis not present

## 2021-10-24 DIAGNOSIS — I2510 Atherosclerotic heart disease of native coronary artery without angina pectoris: Secondary | ICD-10-CM | POA: Diagnosis not present

## 2021-10-24 DIAGNOSIS — Z471 Aftercare following joint replacement surgery: Secondary | ICD-10-CM | POA: Diagnosis not present

## 2021-10-24 DIAGNOSIS — Z7901 Long term (current) use of anticoagulants: Secondary | ICD-10-CM | POA: Diagnosis not present

## 2021-10-24 DIAGNOSIS — E119 Type 2 diabetes mellitus without complications: Secondary | ICD-10-CM | POA: Diagnosis not present

## 2021-10-24 DIAGNOSIS — N400 Enlarged prostate without lower urinary tract symptoms: Secondary | ICD-10-CM | POA: Diagnosis not present

## 2021-10-24 DIAGNOSIS — I10 Essential (primary) hypertension: Secondary | ICD-10-CM | POA: Diagnosis not present

## 2021-10-24 DIAGNOSIS — I350 Nonrheumatic aortic (valve) stenosis: Secondary | ICD-10-CM | POA: Diagnosis not present

## 2021-10-24 DIAGNOSIS — Z8616 Personal history of COVID-19: Secondary | ICD-10-CM | POA: Diagnosis not present

## 2021-10-24 DIAGNOSIS — I340 Nonrheumatic mitral (valve) insufficiency: Secondary | ICD-10-CM | POA: Diagnosis not present

## 2021-10-26 DIAGNOSIS — I340 Nonrheumatic mitral (valve) insufficiency: Secondary | ICD-10-CM | POA: Diagnosis not present

## 2021-10-26 DIAGNOSIS — Z8616 Personal history of COVID-19: Secondary | ICD-10-CM | POA: Diagnosis not present

## 2021-10-26 DIAGNOSIS — E782 Mixed hyperlipidemia: Secondary | ICD-10-CM | POA: Diagnosis not present

## 2021-10-26 DIAGNOSIS — E119 Type 2 diabetes mellitus without complications: Secondary | ICD-10-CM | POA: Diagnosis not present

## 2021-10-26 DIAGNOSIS — I10 Essential (primary) hypertension: Secondary | ICD-10-CM | POA: Diagnosis not present

## 2021-10-26 DIAGNOSIS — R32 Unspecified urinary incontinence: Secondary | ICD-10-CM | POA: Diagnosis not present

## 2021-10-26 DIAGNOSIS — Z955 Presence of coronary angioplasty implant and graft: Secondary | ICD-10-CM | POA: Diagnosis not present

## 2021-10-26 DIAGNOSIS — Z96651 Presence of right artificial knee joint: Secondary | ICD-10-CM | POA: Diagnosis not present

## 2021-10-26 DIAGNOSIS — Z96652 Presence of left artificial knee joint: Secondary | ICD-10-CM | POA: Diagnosis not present

## 2021-10-26 DIAGNOSIS — N400 Enlarged prostate without lower urinary tract symptoms: Secondary | ICD-10-CM | POA: Diagnosis not present

## 2021-10-26 DIAGNOSIS — Z7901 Long term (current) use of anticoagulants: Secondary | ICD-10-CM | POA: Diagnosis not present

## 2021-10-26 DIAGNOSIS — M7522 Bicipital tendinitis, left shoulder: Secondary | ICD-10-CM | POA: Diagnosis not present

## 2021-10-26 DIAGNOSIS — Z9181 History of falling: Secondary | ICD-10-CM | POA: Diagnosis not present

## 2021-10-26 DIAGNOSIS — Z87891 Personal history of nicotine dependence: Secondary | ICD-10-CM | POA: Diagnosis not present

## 2021-10-26 DIAGNOSIS — I350 Nonrheumatic aortic (valve) stenosis: Secondary | ICD-10-CM | POA: Diagnosis not present

## 2021-10-26 DIAGNOSIS — F32A Depression, unspecified: Secondary | ICD-10-CM | POA: Diagnosis not present

## 2021-10-26 DIAGNOSIS — Z951 Presence of aortocoronary bypass graft: Secondary | ICD-10-CM | POA: Diagnosis not present

## 2021-10-26 DIAGNOSIS — I2510 Atherosclerotic heart disease of native coronary artery without angina pectoris: Secondary | ICD-10-CM | POA: Diagnosis not present

## 2021-10-26 DIAGNOSIS — I6523 Occlusion and stenosis of bilateral carotid arteries: Secondary | ICD-10-CM | POA: Diagnosis not present

## 2021-10-26 DIAGNOSIS — I714 Abdominal aortic aneurysm, without rupture, unspecified: Secondary | ICD-10-CM | POA: Diagnosis not present

## 2021-10-26 DIAGNOSIS — Z794 Long term (current) use of insulin: Secondary | ICD-10-CM | POA: Diagnosis not present

## 2021-10-26 DIAGNOSIS — Z96661 Presence of right artificial ankle joint: Secondary | ICD-10-CM | POA: Diagnosis not present

## 2021-10-26 DIAGNOSIS — Z471 Aftercare following joint replacement surgery: Secondary | ICD-10-CM | POA: Diagnosis not present

## 2021-10-26 DIAGNOSIS — Z79891 Long term (current) use of opiate analgesic: Secondary | ICD-10-CM | POA: Diagnosis not present

## 2021-10-29 DIAGNOSIS — R32 Unspecified urinary incontinence: Secondary | ICD-10-CM | POA: Diagnosis not present

## 2021-10-29 DIAGNOSIS — Z96652 Presence of left artificial knee joint: Secondary | ICD-10-CM | POA: Diagnosis not present

## 2021-10-29 DIAGNOSIS — F32A Depression, unspecified: Secondary | ICD-10-CM | POA: Diagnosis not present

## 2021-10-29 DIAGNOSIS — I350 Nonrheumatic aortic (valve) stenosis: Secondary | ICD-10-CM | POA: Diagnosis not present

## 2021-10-29 DIAGNOSIS — I714 Abdominal aortic aneurysm, without rupture, unspecified: Secondary | ICD-10-CM | POA: Diagnosis not present

## 2021-10-29 DIAGNOSIS — Z951 Presence of aortocoronary bypass graft: Secondary | ICD-10-CM | POA: Diagnosis not present

## 2021-10-29 DIAGNOSIS — Z794 Long term (current) use of insulin: Secondary | ICD-10-CM | POA: Diagnosis not present

## 2021-10-29 DIAGNOSIS — E782 Mixed hyperlipidemia: Secondary | ICD-10-CM | POA: Diagnosis not present

## 2021-10-29 DIAGNOSIS — E119 Type 2 diabetes mellitus without complications: Secondary | ICD-10-CM | POA: Diagnosis not present

## 2021-10-29 DIAGNOSIS — N400 Enlarged prostate without lower urinary tract symptoms: Secondary | ICD-10-CM | POA: Diagnosis not present

## 2021-10-29 DIAGNOSIS — Z96651 Presence of right artificial knee joint: Secondary | ICD-10-CM | POA: Diagnosis not present

## 2021-10-29 DIAGNOSIS — M7522 Bicipital tendinitis, left shoulder: Secondary | ICD-10-CM | POA: Diagnosis not present

## 2021-10-29 DIAGNOSIS — I2510 Atherosclerotic heart disease of native coronary artery without angina pectoris: Secondary | ICD-10-CM | POA: Diagnosis not present

## 2021-10-29 DIAGNOSIS — Z87891 Personal history of nicotine dependence: Secondary | ICD-10-CM | POA: Diagnosis not present

## 2021-10-29 DIAGNOSIS — Z471 Aftercare following joint replacement surgery: Secondary | ICD-10-CM | POA: Diagnosis not present

## 2021-10-29 DIAGNOSIS — Z79891 Long term (current) use of opiate analgesic: Secondary | ICD-10-CM | POA: Diagnosis not present

## 2021-10-29 DIAGNOSIS — Z955 Presence of coronary angioplasty implant and graft: Secondary | ICD-10-CM | POA: Diagnosis not present

## 2021-10-29 DIAGNOSIS — I6523 Occlusion and stenosis of bilateral carotid arteries: Secondary | ICD-10-CM | POA: Diagnosis not present

## 2021-10-29 DIAGNOSIS — Z96661 Presence of right artificial ankle joint: Secondary | ICD-10-CM | POA: Diagnosis not present

## 2021-10-29 DIAGNOSIS — Z7901 Long term (current) use of anticoagulants: Secondary | ICD-10-CM | POA: Diagnosis not present

## 2021-10-29 DIAGNOSIS — I340 Nonrheumatic mitral (valve) insufficiency: Secondary | ICD-10-CM | POA: Diagnosis not present

## 2021-10-29 DIAGNOSIS — Z9181 History of falling: Secondary | ICD-10-CM | POA: Diagnosis not present

## 2021-10-29 DIAGNOSIS — Z8616 Personal history of COVID-19: Secondary | ICD-10-CM | POA: Diagnosis not present

## 2021-10-29 DIAGNOSIS — I10 Essential (primary) hypertension: Secondary | ICD-10-CM | POA: Diagnosis not present

## 2021-11-17 DIAGNOSIS — M25661 Stiffness of right knee, not elsewhere classified: Secondary | ICD-10-CM | POA: Diagnosis not present

## 2021-11-17 DIAGNOSIS — M6281 Muscle weakness (generalized): Secondary | ICD-10-CM | POA: Diagnosis not present

## 2021-11-17 DIAGNOSIS — G8929 Other chronic pain: Secondary | ICD-10-CM | POA: Diagnosis not present

## 2021-11-17 DIAGNOSIS — Z96651 Presence of right artificial knee joint: Secondary | ICD-10-CM | POA: Diagnosis not present

## 2021-11-17 DIAGNOSIS — M25561 Pain in right knee: Secondary | ICD-10-CM | POA: Diagnosis not present

## 2021-11-20 DIAGNOSIS — M25561 Pain in right knee: Secondary | ICD-10-CM | POA: Diagnosis not present

## 2021-11-20 DIAGNOSIS — Z96651 Presence of right artificial knee joint: Secondary | ICD-10-CM | POA: Diagnosis not present

## 2021-12-04 DIAGNOSIS — Z96651 Presence of right artificial knee joint: Secondary | ICD-10-CM | POA: Diagnosis not present

## 2021-12-17 DIAGNOSIS — H26491 Other secondary cataract, right eye: Secondary | ICD-10-CM | POA: Diagnosis not present

## 2022-03-29 DIAGNOSIS — E1159 Type 2 diabetes mellitus with other circulatory complications: Secondary | ICD-10-CM | POA: Diagnosis not present

## 2022-03-29 DIAGNOSIS — Z794 Long term (current) use of insulin: Secondary | ICD-10-CM | POA: Diagnosis not present

## 2022-03-29 DIAGNOSIS — Z952 Presence of prosthetic heart valve: Secondary | ICD-10-CM | POA: Diagnosis not present

## 2022-03-29 DIAGNOSIS — M545 Low back pain, unspecified: Secondary | ICD-10-CM | POA: Diagnosis not present

## 2022-03-29 DIAGNOSIS — I7143 Infrarenal abdominal aortic aneurysm, without rupture: Secondary | ICD-10-CM | POA: Diagnosis not present

## 2022-03-29 DIAGNOSIS — G8929 Other chronic pain: Secondary | ICD-10-CM | POA: Diagnosis not present

## 2022-03-29 DIAGNOSIS — I10 Essential (primary) hypertension: Secondary | ICD-10-CM | POA: Diagnosis not present

## 2022-03-29 DIAGNOSIS — E782 Mixed hyperlipidemia: Secondary | ICD-10-CM | POA: Diagnosis not present

## 2022-03-29 DIAGNOSIS — Z0000 Encounter for general adult medical examination without abnormal findings: Secondary | ICD-10-CM | POA: Diagnosis not present

## 2022-04-06 DIAGNOSIS — I2510 Atherosclerotic heart disease of native coronary artery without angina pectoris: Secondary | ICD-10-CM | POA: Diagnosis not present

## 2022-04-06 DIAGNOSIS — I10 Essential (primary) hypertension: Secondary | ICD-10-CM | POA: Diagnosis not present

## 2022-04-06 DIAGNOSIS — I6523 Occlusion and stenosis of bilateral carotid arteries: Secondary | ICD-10-CM | POA: Diagnosis not present

## 2022-04-06 DIAGNOSIS — E782 Mixed hyperlipidemia: Secondary | ICD-10-CM | POA: Diagnosis not present

## 2022-04-06 DIAGNOSIS — I7143 Infrarenal abdominal aortic aneurysm, without rupture: Secondary | ICD-10-CM | POA: Diagnosis not present

## 2022-04-06 DIAGNOSIS — I340 Nonrheumatic mitral (valve) insufficiency: Secondary | ICD-10-CM | POA: Diagnosis not present

## 2022-04-06 DIAGNOSIS — I350 Nonrheumatic aortic (valve) stenosis: Secondary | ICD-10-CM | POA: Diagnosis not present

## 2022-04-16 DIAGNOSIS — Z96651 Presence of right artificial knee joint: Secondary | ICD-10-CM | POA: Diagnosis not present

## 2022-04-16 DIAGNOSIS — M1731 Unilateral post-traumatic osteoarthritis, right knee: Secondary | ICD-10-CM | POA: Diagnosis not present

## 2022-05-04 DIAGNOSIS — I350 Nonrheumatic aortic (valve) stenosis: Secondary | ICD-10-CM | POA: Diagnosis not present

## 2022-05-04 DIAGNOSIS — I2510 Atherosclerotic heart disease of native coronary artery without angina pectoris: Secondary | ICD-10-CM | POA: Diagnosis not present

## 2022-05-07 DIAGNOSIS — I350 Nonrheumatic aortic (valve) stenosis: Secondary | ICD-10-CM | POA: Diagnosis not present

## 2022-05-07 DIAGNOSIS — I2510 Atherosclerotic heart disease of native coronary artery without angina pectoris: Secondary | ICD-10-CM | POA: Diagnosis not present

## 2022-05-10 DIAGNOSIS — I6523 Occlusion and stenosis of bilateral carotid arteries: Secondary | ICD-10-CM | POA: Diagnosis not present

## 2022-05-10 DIAGNOSIS — I10 Essential (primary) hypertension: Secondary | ICD-10-CM | POA: Diagnosis not present

## 2022-05-10 DIAGNOSIS — I7143 Infrarenal abdominal aortic aneurysm, without rupture: Secondary | ICD-10-CM | POA: Diagnosis not present

## 2022-05-10 DIAGNOSIS — Z952 Presence of prosthetic heart valve: Secondary | ICD-10-CM | POA: Diagnosis not present

## 2022-05-10 DIAGNOSIS — I2510 Atherosclerotic heart disease of native coronary artery without angina pectoris: Secondary | ICD-10-CM | POA: Diagnosis not present

## 2022-06-17 DIAGNOSIS — H401122 Primary open-angle glaucoma, left eye, moderate stage: Secondary | ICD-10-CM | POA: Diagnosis not present

## 2022-07-01 DIAGNOSIS — H401122 Primary open-angle glaucoma, left eye, moderate stage: Secondary | ICD-10-CM | POA: Diagnosis not present

## 2022-07-12 DIAGNOSIS — M9903 Segmental and somatic dysfunction of lumbar region: Secondary | ICD-10-CM | POA: Diagnosis not present

## 2022-07-12 DIAGNOSIS — M9902 Segmental and somatic dysfunction of thoracic region: Secondary | ICD-10-CM | POA: Diagnosis not present

## 2022-07-12 DIAGNOSIS — M62830 Muscle spasm of back: Secondary | ICD-10-CM | POA: Diagnosis not present

## 2022-07-12 DIAGNOSIS — M5136 Other intervertebral disc degeneration, lumbar region: Secondary | ICD-10-CM | POA: Diagnosis not present

## 2022-08-09 DIAGNOSIS — M9903 Segmental and somatic dysfunction of lumbar region: Secondary | ICD-10-CM | POA: Diagnosis not present

## 2022-08-09 DIAGNOSIS — M9902 Segmental and somatic dysfunction of thoracic region: Secondary | ICD-10-CM | POA: Diagnosis not present

## 2022-08-09 DIAGNOSIS — M5134 Other intervertebral disc degeneration, thoracic region: Secondary | ICD-10-CM | POA: Diagnosis not present

## 2022-08-09 DIAGNOSIS — M5136 Other intervertebral disc degeneration, lumbar region: Secondary | ICD-10-CM | POA: Diagnosis not present

## 2022-12-06 DIAGNOSIS — R194 Change in bowel habit: Secondary | ICD-10-CM | POA: Diagnosis not present

## 2022-12-06 DIAGNOSIS — K5900 Constipation, unspecified: Secondary | ICD-10-CM | POA: Diagnosis not present

## 2023-01-03 DIAGNOSIS — E1159 Type 2 diabetes mellitus with other circulatory complications: Secondary | ICD-10-CM | POA: Diagnosis not present

## 2023-01-03 DIAGNOSIS — H401111 Primary open-angle glaucoma, right eye, mild stage: Secondary | ICD-10-CM | POA: Diagnosis not present

## 2023-01-03 DIAGNOSIS — H401122 Primary open-angle glaucoma, left eye, moderate stage: Secondary | ICD-10-CM | POA: Diagnosis not present

## 2023-01-03 DIAGNOSIS — Z03818 Encounter for observation for suspected exposure to other biological agents ruled out: Secondary | ICD-10-CM | POA: Diagnosis not present

## 2023-01-03 DIAGNOSIS — J040 Acute laryngitis: Secondary | ICD-10-CM | POA: Diagnosis not present

## 2023-01-03 DIAGNOSIS — Z794 Long term (current) use of insulin: Secondary | ICD-10-CM | POA: Diagnosis not present

## 2023-01-10 DIAGNOSIS — H401122 Primary open-angle glaucoma, left eye, moderate stage: Secondary | ICD-10-CM | POA: Diagnosis not present

## 2023-01-10 DIAGNOSIS — H401111 Primary open-angle glaucoma, right eye, mild stage: Secondary | ICD-10-CM | POA: Diagnosis not present

## 2023-05-19 DIAGNOSIS — H401122 Primary open-angle glaucoma, left eye, moderate stage: Secondary | ICD-10-CM | POA: Diagnosis not present

## 2023-05-19 DIAGNOSIS — H401111 Primary open-angle glaucoma, right eye, mild stage: Secondary | ICD-10-CM | POA: Diagnosis not present

## 2023-05-27 DIAGNOSIS — I6523 Occlusion and stenosis of bilateral carotid arteries: Secondary | ICD-10-CM | POA: Diagnosis not present

## 2023-05-27 DIAGNOSIS — I2510 Atherosclerotic heart disease of native coronary artery without angina pectoris: Secondary | ICD-10-CM | POA: Diagnosis not present

## 2023-05-27 DIAGNOSIS — I350 Nonrheumatic aortic (valve) stenosis: Secondary | ICD-10-CM | POA: Diagnosis not present

## 2023-05-27 DIAGNOSIS — R001 Bradycardia, unspecified: Secondary | ICD-10-CM | POA: Diagnosis not present

## 2023-05-27 DIAGNOSIS — I7143 Infrarenal abdominal aortic aneurysm, without rupture: Secondary | ICD-10-CM | POA: Diagnosis not present

## 2023-05-27 DIAGNOSIS — E782 Mixed hyperlipidemia: Secondary | ICD-10-CM | POA: Diagnosis not present

## 2023-05-27 DIAGNOSIS — I340 Nonrheumatic mitral (valve) insufficiency: Secondary | ICD-10-CM | POA: Diagnosis not present

## 2023-05-27 DIAGNOSIS — I10 Essential (primary) hypertension: Secondary | ICD-10-CM | POA: Diagnosis not present

## 2023-05-27 DIAGNOSIS — I498 Other specified cardiac arrhythmias: Secondary | ICD-10-CM | POA: Diagnosis not present

## 2023-05-27 DIAGNOSIS — Z952 Presence of prosthetic heart valve: Secondary | ICD-10-CM | POA: Diagnosis not present

## 2023-05-31 DIAGNOSIS — Z952 Presence of prosthetic heart valve: Secondary | ICD-10-CM | POA: Diagnosis not present

## 2023-05-31 DIAGNOSIS — I340 Nonrheumatic mitral (valve) insufficiency: Secondary | ICD-10-CM | POA: Diagnosis not present

## 2023-05-31 DIAGNOSIS — I350 Nonrheumatic aortic (valve) stenosis: Secondary | ICD-10-CM | POA: Diagnosis not present

## 2023-06-06 DIAGNOSIS — I498 Other specified cardiac arrhythmias: Secondary | ICD-10-CM | POA: Diagnosis not present

## 2023-06-06 DIAGNOSIS — R001 Bradycardia, unspecified: Secondary | ICD-10-CM | POA: Diagnosis not present

## 2023-06-10 DIAGNOSIS — R001 Bradycardia, unspecified: Secondary | ICD-10-CM | POA: Diagnosis not present

## 2023-06-10 DIAGNOSIS — I498 Other specified cardiac arrhythmias: Secondary | ICD-10-CM | POA: Diagnosis not present

## 2023-06-13 DIAGNOSIS — G8929 Other chronic pain: Secondary | ICD-10-CM | POA: Diagnosis not present

## 2023-06-13 DIAGNOSIS — R490 Dysphonia: Secondary | ICD-10-CM | POA: Diagnosis not present

## 2023-06-13 DIAGNOSIS — Z794 Long term (current) use of insulin: Secondary | ICD-10-CM | POA: Diagnosis not present

## 2023-06-13 DIAGNOSIS — Z0000 Encounter for general adult medical examination without abnormal findings: Secondary | ICD-10-CM | POA: Diagnosis not present

## 2023-06-13 DIAGNOSIS — I10 Essential (primary) hypertension: Secondary | ICD-10-CM | POA: Diagnosis not present

## 2023-06-13 DIAGNOSIS — I7143 Infrarenal abdominal aortic aneurysm, without rupture: Secondary | ICD-10-CM | POA: Diagnosis not present

## 2023-06-13 DIAGNOSIS — E782 Mixed hyperlipidemia: Secondary | ICD-10-CM | POA: Diagnosis not present

## 2023-06-13 DIAGNOSIS — E1159 Type 2 diabetes mellitus with other circulatory complications: Secondary | ICD-10-CM | POA: Diagnosis not present

## 2023-06-13 DIAGNOSIS — M545 Low back pain, unspecified: Secondary | ICD-10-CM | POA: Diagnosis not present

## 2023-06-13 DIAGNOSIS — Z952 Presence of prosthetic heart valve: Secondary | ICD-10-CM | POA: Diagnosis not present

## 2023-06-21 DIAGNOSIS — E782 Mixed hyperlipidemia: Secondary | ICD-10-CM | POA: Diagnosis not present

## 2023-06-21 DIAGNOSIS — Z794 Long term (current) use of insulin: Secondary | ICD-10-CM | POA: Diagnosis not present

## 2023-06-21 DIAGNOSIS — G8929 Other chronic pain: Secondary | ICD-10-CM | POA: Diagnosis not present

## 2023-06-21 DIAGNOSIS — E1159 Type 2 diabetes mellitus with other circulatory complications: Secondary | ICD-10-CM | POA: Diagnosis not present

## 2023-07-04 ENCOUNTER — Ambulatory Visit: Payer: HMO | Admitting: Speech Pathology

## 2023-07-08 ENCOUNTER — Ambulatory Visit: Payer: HMO | Admitting: Speech Pathology

## 2023-07-12 ENCOUNTER — Ambulatory Visit: Payer: HMO | Admitting: Speech Pathology

## 2023-07-14 ENCOUNTER — Ambulatory Visit: Payer: HMO | Admitting: Speech Pathology

## 2023-07-19 ENCOUNTER — Ambulatory Visit: Payer: HMO | Admitting: Speech Pathology

## 2023-07-21 ENCOUNTER — Ambulatory Visit: Payer: HMO | Admitting: Speech Pathology

## 2023-07-22 DIAGNOSIS — I6523 Occlusion and stenosis of bilateral carotid arteries: Secondary | ICD-10-CM | POA: Diagnosis not present

## 2023-07-22 DIAGNOSIS — Z952 Presence of prosthetic heart valve: Secondary | ICD-10-CM | POA: Diagnosis not present

## 2023-07-22 DIAGNOSIS — I350 Nonrheumatic aortic (valve) stenosis: Secondary | ICD-10-CM | POA: Diagnosis not present

## 2023-07-22 DIAGNOSIS — I7143 Infrarenal abdominal aortic aneurysm, without rupture: Secondary | ICD-10-CM | POA: Diagnosis not present

## 2023-07-22 DIAGNOSIS — E782 Mixed hyperlipidemia: Secondary | ICD-10-CM | POA: Diagnosis not present

## 2023-07-22 DIAGNOSIS — I2510 Atherosclerotic heart disease of native coronary artery without angina pectoris: Secondary | ICD-10-CM | POA: Diagnosis not present

## 2023-07-22 DIAGNOSIS — I471 Supraventricular tachycardia, unspecified: Secondary | ICD-10-CM | POA: Diagnosis not present

## 2023-07-22 DIAGNOSIS — I10 Essential (primary) hypertension: Secondary | ICD-10-CM | POA: Diagnosis not present

## 2023-07-22 DIAGNOSIS — I491 Atrial premature depolarization: Secondary | ICD-10-CM | POA: Diagnosis not present

## 2023-07-22 DIAGNOSIS — I340 Nonrheumatic mitral (valve) insufficiency: Secondary | ICD-10-CM | POA: Diagnosis not present

## 2023-07-22 DIAGNOSIS — R001 Bradycardia, unspecified: Secondary | ICD-10-CM | POA: Diagnosis not present

## 2023-07-28 ENCOUNTER — Ambulatory Visit: Payer: HMO | Admitting: Speech Pathology

## 2023-08-02 ENCOUNTER — Ambulatory Visit: Payer: HMO | Admitting: Speech Pathology

## 2023-08-04 ENCOUNTER — Ambulatory Visit: Payer: HMO | Admitting: Speech Pathology

## 2023-08-09 ENCOUNTER — Ambulatory Visit: Payer: HMO | Admitting: Speech Pathology

## 2023-08-11 ENCOUNTER — Ambulatory Visit: Payer: HMO | Admitting: Speech Pathology

## 2023-08-16 ENCOUNTER — Ambulatory Visit: Payer: HMO | Admitting: Speech Pathology

## 2023-08-18 ENCOUNTER — Ambulatory Visit: Payer: HMO | Admitting: Speech Pathology

## 2023-08-23 ENCOUNTER — Ambulatory Visit: Payer: HMO | Admitting: Speech Pathology

## 2023-08-25 ENCOUNTER — Ambulatory Visit: Payer: HMO | Admitting: Speech Pathology

## 2023-10-05 DIAGNOSIS — M7582 Other shoulder lesions, left shoulder: Secondary | ICD-10-CM | POA: Diagnosis not present

## 2023-10-05 DIAGNOSIS — M7522 Bicipital tendinitis, left shoulder: Secondary | ICD-10-CM | POA: Diagnosis not present

## 2023-11-18 DIAGNOSIS — H401111 Primary open-angle glaucoma, right eye, mild stage: Secondary | ICD-10-CM | POA: Diagnosis not present

## 2023-11-18 DIAGNOSIS — H401122 Primary open-angle glaucoma, left eye, moderate stage: Secondary | ICD-10-CM | POA: Diagnosis not present

## 2023-11-30 DIAGNOSIS — E1159 Type 2 diabetes mellitus with other circulatory complications: Secondary | ICD-10-CM | POA: Diagnosis not present

## 2023-11-30 DIAGNOSIS — Z794 Long term (current) use of insulin: Secondary | ICD-10-CM | POA: Diagnosis not present

## 2023-11-30 DIAGNOSIS — M7522 Bicipital tendinitis, left shoulder: Secondary | ICD-10-CM | POA: Diagnosis not present

## 2023-11-30 DIAGNOSIS — M7582 Other shoulder lesions, left shoulder: Secondary | ICD-10-CM | POA: Diagnosis not present

## 2024-01-09 DIAGNOSIS — E782 Mixed hyperlipidemia: Secondary | ICD-10-CM | POA: Diagnosis not present

## 2024-01-09 DIAGNOSIS — I10 Essential (primary) hypertension: Secondary | ICD-10-CM | POA: Diagnosis not present

## 2024-01-09 DIAGNOSIS — M545 Low back pain, unspecified: Secondary | ICD-10-CM | POA: Diagnosis not present

## 2024-01-09 DIAGNOSIS — Z952 Presence of prosthetic heart valve: Secondary | ICD-10-CM | POA: Diagnosis not present

## 2024-01-09 DIAGNOSIS — G8929 Other chronic pain: Secondary | ICD-10-CM | POA: Diagnosis not present

## 2024-01-09 DIAGNOSIS — I7143 Infrarenal abdominal aortic aneurysm, without rupture: Secondary | ICD-10-CM | POA: Diagnosis not present

## 2024-01-09 DIAGNOSIS — Z794 Long term (current) use of insulin: Secondary | ICD-10-CM | POA: Diagnosis not present

## 2024-01-09 DIAGNOSIS — E1159 Type 2 diabetes mellitus with other circulatory complications: Secondary | ICD-10-CM | POA: Diagnosis not present

## 2024-05-08 DIAGNOSIS — H401122 Primary open-angle glaucoma, left eye, moderate stage: Secondary | ICD-10-CM | POA: Diagnosis not present

## 2024-05-26 ENCOUNTER — Ambulatory Visit: Admission: EM | Admit: 2024-05-26 | Discharge: 2024-05-26 | Disposition: A | Discharge status: Home / Self Care

## 2024-05-26 ENCOUNTER — Ambulatory Visit (INDEPENDENT_AMBULATORY_CARE_PROVIDER_SITE_OTHER)

## 2024-05-26 ENCOUNTER — Encounter: Payer: Self-pay | Admitting: Emergency Medicine

## 2024-05-26 DIAGNOSIS — I700 Atherosclerosis of aorta: Secondary | ICD-10-CM | POA: Diagnosis not present

## 2024-05-26 DIAGNOSIS — R053 Chronic cough: Secondary | ICD-10-CM

## 2024-05-26 DIAGNOSIS — G9331 Postviral fatigue syndrome: Secondary | ICD-10-CM | POA: Diagnosis not present

## 2024-05-26 DIAGNOSIS — R062 Wheezing: Secondary | ICD-10-CM | POA: Diagnosis not present

## 2024-05-26 DIAGNOSIS — R0602 Shortness of breath: Secondary | ICD-10-CM | POA: Diagnosis not present

## 2024-05-26 MED ORDER — AZELASTINE HCL 0.1 % NA SOLN: 1.0000 | Freq: Two times a day (BID) | NASAL | 1 refills | Status: AC

## 2024-05-26 MED ORDER — PROMETHAZINE-DM 6.25-15 MG/5ML PO SYRP: 5.0000 mL | ORAL_SOLUTION | Freq: Four times a day (QID) | ORAL | 0 refills | Status: AC | PRN

## 2024-05-26 NOTE — ED Triage Notes (Signed)
 Nasal congestion, cough with greenish yellow phlegm x 1 week and half. Patient reports he only took allergy pills and Tylenol . Last took medications yesterday. Denies pain.

## 2024-05-26 NOTE — Discharge Instructions (Signed)
  1. Postviral syndrome (Primary) - DG Chest 2 View x-ray shows no acute cardiopulmonary processes, no sign of consolidation or pneumonia, possible bronchial artery prominence, consistent with possible pulmonary hypertension.  Recommend follow-up with PCP for further evaluation and consultation about pulmonary hypertension. - azelastine  (ASTELIN ) 0.1 % nasal spray; Place 1 spray into both nostrils 2 (two) times daily. Use in each nostril as directed  Dispense: 30 mL; Refill: 1 - promethazine -dextromethorphan  (PROMETHAZINE -DM) 6.25-15 MG/5ML syrup; Take 5 mLs by mouth 4 (four) times daily as needed for cough.  Dispense: 118 mL; Refill: 0 - Continue taking daily cetirizine allergy medication to help with systemic allergic symptoms secondary to postviral syndrome. - Continue to monitor current symptoms for any change in severity if there is any escalation of current symptoms or development of new symptoms follow-up for further evaluation and management.
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
## 2024-06-05 DIAGNOSIS — M955 Acquired deformity of pelvis: Secondary | ICD-10-CM | POA: Diagnosis not present

## 2024-06-05 DIAGNOSIS — M5134 Other intervertebral disc degeneration, thoracic region: Secondary | ICD-10-CM | POA: Diagnosis not present

## 2024-06-05 DIAGNOSIS — M62830 Muscle spasm of back: Secondary | ICD-10-CM | POA: Diagnosis not present

## 2024-06-05 DIAGNOSIS — M9902 Segmental and somatic dysfunction of thoracic region: Secondary | ICD-10-CM | POA: Diagnosis not present

## 2024-06-05 DIAGNOSIS — M9905 Segmental and somatic dysfunction of pelvic region: Secondary | ICD-10-CM | POA: Diagnosis not present

## 2024-06-05 DIAGNOSIS — M9904 Segmental and somatic dysfunction of sacral region: Secondary | ICD-10-CM | POA: Diagnosis not present

## 2024-06-05 DIAGNOSIS — M5136 Other intervertebral disc degeneration, lumbar region with discogenic back pain only: Secondary | ICD-10-CM | POA: Diagnosis not present

## 2024-06-05 DIAGNOSIS — M9903 Segmental and somatic dysfunction of lumbar region: Secondary | ICD-10-CM | POA: Diagnosis not present

## 2024-06-11 DIAGNOSIS — Z1331 Encounter for screening for depression: Secondary | ICD-10-CM | POA: Diagnosis not present

## 2024-06-11 DIAGNOSIS — M545 Low back pain, unspecified: Secondary | ICD-10-CM | POA: Diagnosis not present

## 2024-06-11 DIAGNOSIS — G8929 Other chronic pain: Secondary | ICD-10-CM | POA: Diagnosis not present

## 2024-07-03 DIAGNOSIS — M5134 Other intervertebral disc degeneration, thoracic region: Secondary | ICD-10-CM | POA: Diagnosis not present

## 2024-07-03 DIAGNOSIS — M955 Acquired deformity of pelvis: Secondary | ICD-10-CM | POA: Diagnosis not present

## 2024-07-03 DIAGNOSIS — M9902 Segmental and somatic dysfunction of thoracic region: Secondary | ICD-10-CM | POA: Diagnosis not present

## 2024-07-03 DIAGNOSIS — M9905 Segmental and somatic dysfunction of pelvic region: Secondary | ICD-10-CM | POA: Diagnosis not present

## 2024-07-03 DIAGNOSIS — M9904 Segmental and somatic dysfunction of sacral region: Secondary | ICD-10-CM | POA: Diagnosis not present

## 2024-07-03 DIAGNOSIS — M62830 Muscle spasm of back: Secondary | ICD-10-CM | POA: Diagnosis not present

## 2024-07-03 DIAGNOSIS — M5136 Other intervertebral disc degeneration, lumbar region with discogenic back pain only: Secondary | ICD-10-CM | POA: Diagnosis not present

## 2024-07-03 DIAGNOSIS — M9903 Segmental and somatic dysfunction of lumbar region: Secondary | ICD-10-CM | POA: Diagnosis not present

## 2024-07-06 DIAGNOSIS — E113293 Type 2 diabetes mellitus with mild nonproliferative diabetic retinopathy without macular edema, bilateral: Secondary | ICD-10-CM | POA: Diagnosis not present

## 2024-07-06 DIAGNOSIS — H401122 Primary open-angle glaucoma, left eye, moderate stage: Secondary | ICD-10-CM | POA: Diagnosis not present

## 2024-07-06 DIAGNOSIS — H401111 Primary open-angle glaucoma, right eye, mild stage: Secondary | ICD-10-CM | POA: Diagnosis not present

## 2024-07-06 DIAGNOSIS — H26491 Other secondary cataract, right eye: Secondary | ICD-10-CM | POA: Diagnosis not present

## 2024-07-10 DIAGNOSIS — E782 Mixed hyperlipidemia: Secondary | ICD-10-CM | POA: Diagnosis not present

## 2024-07-10 DIAGNOSIS — I6523 Occlusion and stenosis of bilateral carotid arteries: Secondary | ICD-10-CM | POA: Diagnosis not present

## 2024-07-10 DIAGNOSIS — I340 Nonrheumatic mitral (valve) insufficiency: Secondary | ICD-10-CM | POA: Diagnosis not present

## 2024-07-10 DIAGNOSIS — R001 Bradycardia, unspecified: Secondary | ICD-10-CM | POA: Diagnosis not present

## 2024-07-10 DIAGNOSIS — I350 Nonrheumatic aortic (valve) stenosis: Secondary | ICD-10-CM | POA: Diagnosis not present

## 2024-07-10 DIAGNOSIS — E1159 Type 2 diabetes mellitus with other circulatory complications: Secondary | ICD-10-CM | POA: Diagnosis not present

## 2024-07-10 DIAGNOSIS — I2510 Atherosclerotic heart disease of native coronary artery without angina pectoris: Secondary | ICD-10-CM | POA: Diagnosis not present

## 2024-07-10 DIAGNOSIS — I491 Atrial premature depolarization: Secondary | ICD-10-CM | POA: Diagnosis not present

## 2024-07-10 DIAGNOSIS — I7143 Infrarenal abdominal aortic aneurysm, without rupture: Secondary | ICD-10-CM | POA: Diagnosis not present

## 2024-07-10 DIAGNOSIS — Z952 Presence of prosthetic heart valve: Secondary | ICD-10-CM | POA: Diagnosis not present

## 2024-07-10 DIAGNOSIS — Z794 Long term (current) use of insulin: Secondary | ICD-10-CM | POA: Diagnosis not present

## 2024-07-10 DIAGNOSIS — I10 Essential (primary) hypertension: Secondary | ICD-10-CM | POA: Diagnosis not present

## 2024-07-11 DIAGNOSIS — M47816 Spondylosis without myelopathy or radiculopathy, lumbar region: Secondary | ICD-10-CM | POA: Diagnosis not present

## 2024-07-11 DIAGNOSIS — M545 Low back pain, unspecified: Secondary | ICD-10-CM | POA: Diagnosis not present

## 2024-07-11 DIAGNOSIS — M5136 Other intervertebral disc degeneration, lumbar region with discogenic back pain only: Secondary | ICD-10-CM | POA: Diagnosis not present

## 2024-07-11 DIAGNOSIS — I10 Essential (primary) hypertension: Secondary | ICD-10-CM | POA: Diagnosis not present

## 2024-07-11 DIAGNOSIS — Z1331 Encounter for screening for depression: Secondary | ICD-10-CM | POA: Diagnosis not present

## 2024-07-11 DIAGNOSIS — Z1330 Encounter for screening examination for mental health and behavioral disorders, unspecified: Secondary | ICD-10-CM | POA: Diagnosis not present

## 2024-07-11 DIAGNOSIS — E782 Mixed hyperlipidemia: Secondary | ICD-10-CM | POA: Diagnosis not present

## 2024-07-11 DIAGNOSIS — Z0000 Encounter for general adult medical examination without abnormal findings: Secondary | ICD-10-CM | POA: Diagnosis not present

## 2024-07-11 DIAGNOSIS — Z952 Presence of prosthetic heart valve: Secondary | ICD-10-CM | POA: Diagnosis not present

## 2024-07-11 DIAGNOSIS — G8929 Other chronic pain: Secondary | ICD-10-CM | POA: Diagnosis not present

## 2024-07-11 DIAGNOSIS — I7143 Infrarenal abdominal aortic aneurysm, without rupture: Secondary | ICD-10-CM | POA: Diagnosis not present

## 2024-07-11 DIAGNOSIS — E1159 Type 2 diabetes mellitus with other circulatory complications: Secondary | ICD-10-CM | POA: Diagnosis not present

## 2024-07-11 DIAGNOSIS — Z794 Long term (current) use of insulin: Secondary | ICD-10-CM | POA: Diagnosis not present

## 2024-07-16 DIAGNOSIS — M47816 Spondylosis without myelopathy or radiculopathy, lumbar region: Secondary | ICD-10-CM | POA: Diagnosis not present

## 2024-07-16 DIAGNOSIS — E782 Mixed hyperlipidemia: Secondary | ICD-10-CM | POA: Diagnosis not present

## 2024-07-16 DIAGNOSIS — I10 Essential (primary) hypertension: Secondary | ICD-10-CM | POA: Diagnosis not present

## 2024-07-16 DIAGNOSIS — Z794 Long term (current) use of insulin: Secondary | ICD-10-CM | POA: Diagnosis not present

## 2024-07-16 DIAGNOSIS — E1159 Type 2 diabetes mellitus with other circulatory complications: Secondary | ICD-10-CM | POA: Diagnosis not present

## 2024-07-16 DIAGNOSIS — E119 Type 2 diabetes mellitus without complications: Secondary | ICD-10-CM | POA: Diagnosis not present

## 2024-07-20 DIAGNOSIS — I2510 Atherosclerotic heart disease of native coronary artery without angina pectoris: Secondary | ICD-10-CM | POA: Diagnosis not present

## 2024-07-20 DIAGNOSIS — I7143 Infrarenal abdominal aortic aneurysm, without rupture: Secondary | ICD-10-CM | POA: Diagnosis not present

## 2024-07-20 DIAGNOSIS — I350 Nonrheumatic aortic (valve) stenosis: Secondary | ICD-10-CM | POA: Diagnosis not present

## 2024-07-20 DIAGNOSIS — I340 Nonrheumatic mitral (valve) insufficiency: Secondary | ICD-10-CM | POA: Diagnosis not present

## 2024-08-01 DIAGNOSIS — M47816 Spondylosis without myelopathy or radiculopathy, lumbar region: Secondary | ICD-10-CM | POA: Diagnosis not present

## 2024-08-27 DIAGNOSIS — M47816 Spondylosis without myelopathy or radiculopathy, lumbar region: Secondary | ICD-10-CM | POA: Diagnosis not present

## 2024-09-10 DIAGNOSIS — M47816 Spondylosis without myelopathy or radiculopathy, lumbar region: Secondary | ICD-10-CM | POA: Diagnosis not present

## 2024-10-17 DIAGNOSIS — M47816 Spondylosis without myelopathy or radiculopathy, lumbar region: Secondary | ICD-10-CM | POA: Diagnosis not present

## 2024-10-17 DIAGNOSIS — M5136 Other intervertebral disc degeneration, lumbar region with discogenic back pain only: Secondary | ICD-10-CM | POA: Diagnosis not present

## 2024-10-22 DIAGNOSIS — M47816 Spondylosis without myelopathy or radiculopathy, lumbar region: Secondary | ICD-10-CM | POA: Diagnosis not present
